# Patient Record
Sex: Female | Born: 1990 | Race: White | Hispanic: No | Marital: Married | Smoking: Never smoker
Health system: Southern US, Academic
[De-identification: ages and names within clinical notes are randomized; demographics above are authoritative.]

## PROBLEM LIST (undated history)

## (undated) DIAGNOSIS — J45909 Unspecified asthma, uncomplicated: Secondary | ICD-10-CM

## (undated) DIAGNOSIS — R51 Headache: Secondary | ICD-10-CM

## (undated) DIAGNOSIS — F419 Anxiety disorder, unspecified: Secondary | ICD-10-CM

## (undated) DIAGNOSIS — R519 Headache, unspecified: Secondary | ICD-10-CM

## (undated) HISTORY — PX: TONSILLECTOMY: SUR1361

## (undated) HISTORY — DX: Headache, unspecified: R51.9

## (undated) HISTORY — DX: Headache: R51

## (undated) HISTORY — DX: Unspecified asthma, uncomplicated: J45.909

## (undated) HISTORY — PX: HX TONSILLECTOMY: SHX27

---

## 2017-06-26 ENCOUNTER — Other Ambulatory Visit: Payer: Self-pay | Admitting: Nurse Practitioner

## 2017-06-26 ENCOUNTER — Other Ambulatory Visit (HOSPITAL_COMMUNITY)
Admission: RE | Admit: 2017-06-26 | Discharge: 2017-06-26 | Disposition: A | Discharge status: Home / Self Care | Payer: BLUE CROSS/BLUE SHIELD | Source: Ambulatory Visit | Attending: Nurse Practitioner | Admitting: Nurse Practitioner

## 2017-06-26 DIAGNOSIS — F411 Generalized anxiety disorder: Secondary | ICD-10-CM | POA: Diagnosis not present

## 2017-06-26 DIAGNOSIS — Z3169 Encounter for other general counseling and advice on procreation: Secondary | ICD-10-CM | POA: Diagnosis not present

## 2017-06-26 DIAGNOSIS — Z01419 Encounter for gynecological examination (general) (routine) without abnormal findings: Secondary | ICD-10-CM | POA: Insufficient documentation

## 2017-06-28 LAB — CYTOLOGY - PAP: Diagnosis: NEGATIVE

## 2017-08-23 DIAGNOSIS — Z13220 Encounter for screening for lipoid disorders: Secondary | ICD-10-CM | POA: Diagnosis not present

## 2017-08-23 DIAGNOSIS — Z0000 Encounter for general adult medical examination without abnormal findings: Secondary | ICD-10-CM | POA: Diagnosis not present

## 2017-09-17 DIAGNOSIS — Z713 Dietary counseling and surveillance: Secondary | ICD-10-CM | POA: Diagnosis not present

## 2017-11-15 DIAGNOSIS — Z3201 Encounter for pregnancy test, result positive: Secondary | ICD-10-CM | POA: Diagnosis not present

## 2017-12-10 DIAGNOSIS — Z713 Dietary counseling and surveillance: Secondary | ICD-10-CM | POA: Diagnosis not present

## 2017-12-14 LAB — OB RESULTS CONSOLE ABO/RH: RH TYPE: POSITIVE

## 2017-12-14 LAB — OB RESULTS CONSOLE RPR: RPR: NONREACTIVE

## 2017-12-14 LAB — OB RESULTS CONSOLE HIV ANTIBODY (ROUTINE TESTING): HIV: NONREACTIVE

## 2017-12-14 LAB — OB RESULTS CONSOLE GC/CHLAMYDIA
CHLAMYDIA, DNA PROBE: NEGATIVE
Gonorrhea: NEGATIVE

## 2017-12-14 LAB — OB RESULTS CONSOLE HEPATITIS B SURFACE ANTIGEN: Hepatitis B Surface Ag: NEGATIVE

## 2017-12-14 LAB — OB RESULTS CONSOLE RUBELLA ANTIBODY, IGM: Rubella: IMMUNE

## 2017-12-14 LAB — OB RESULTS CONSOLE ANTIBODY SCREEN: Antibody Screen: NEGATIVE

## 2017-12-20 DIAGNOSIS — Z3402 Encounter for supervision of normal first pregnancy, second trimester: Secondary | ICD-10-CM | POA: Diagnosis not present

## 2018-01-15 DIAGNOSIS — Z3400 Encounter for supervision of normal first pregnancy, unspecified trimester: Secondary | ICD-10-CM | POA: Diagnosis not present

## 2018-01-15 DIAGNOSIS — Z3402 Encounter for supervision of normal first pregnancy, second trimester: Secondary | ICD-10-CM | POA: Diagnosis not present

## 2018-02-12 DIAGNOSIS — Z3402 Encounter for supervision of normal first pregnancy, second trimester: Secondary | ICD-10-CM | POA: Diagnosis not present

## 2018-02-12 DIAGNOSIS — Z36 Encounter for antenatal screening for chromosomal anomalies: Secondary | ICD-10-CM | POA: Diagnosis not present

## 2018-04-04 DIAGNOSIS — Z3402 Encounter for supervision of normal first pregnancy, second trimester: Secondary | ICD-10-CM | POA: Diagnosis not present

## 2018-04-26 DIAGNOSIS — Z23 Encounter for immunization: Secondary | ICD-10-CM | POA: Diagnosis not present

## 2018-06-07 DIAGNOSIS — Z3403 Encounter for supervision of normal first pregnancy, third trimester: Secondary | ICD-10-CM | POA: Diagnosis not present

## 2018-06-14 DIAGNOSIS — O368390 Maternal care for abnormalities of the fetal heart rate or rhythm, unspecified trimester, not applicable or unspecified: Secondary | ICD-10-CM | POA: Diagnosis not present

## 2018-07-04 ENCOUNTER — Encounter (HOSPITAL_COMMUNITY): Payer: Self-pay | Admitting: *Deleted

## 2018-07-04 ENCOUNTER — Inpatient Hospital Stay (HOSPITAL_COMMUNITY)
Admission: AD | Admit: 2018-07-04 | Discharge: 2018-07-04 | Disposition: A | Discharge status: Home / Self Care | Payer: BLUE CROSS/BLUE SHIELD | Attending: Obstetrics & Gynecology | Admitting: Obstetrics & Gynecology

## 2018-07-04 ENCOUNTER — Other Ambulatory Visit: Payer: Self-pay

## 2018-07-04 DIAGNOSIS — O99333 Smoking (tobacco) complicating pregnancy, third trimester: Secondary | ICD-10-CM | POA: Diagnosis not present

## 2018-07-04 DIAGNOSIS — F17210 Nicotine dependence, cigarettes, uncomplicated: Secondary | ICD-10-CM | POA: Insufficient documentation

## 2018-07-04 DIAGNOSIS — Z880 Allergy status to penicillin: Secondary | ICD-10-CM | POA: Diagnosis not present

## 2018-07-04 DIAGNOSIS — O99513 Diseases of the respiratory system complicating pregnancy, third trimester: Secondary | ICD-10-CM | POA: Insufficient documentation

## 2018-07-04 DIAGNOSIS — O99343 Other mental disorders complicating pregnancy, third trimester: Secondary | ICD-10-CM | POA: Diagnosis not present

## 2018-07-04 DIAGNOSIS — J45909 Unspecified asthma, uncomplicated: Secondary | ICD-10-CM | POA: Diagnosis not present

## 2018-07-04 DIAGNOSIS — Z881 Allergy status to other antibiotic agents status: Secondary | ICD-10-CM | POA: Diagnosis not present

## 2018-07-04 DIAGNOSIS — Z818 Family history of other mental and behavioral disorders: Secondary | ICD-10-CM | POA: Insufficient documentation

## 2018-07-04 DIAGNOSIS — Z3A40 40 weeks gestation of pregnancy: Secondary | ICD-10-CM | POA: Diagnosis not present

## 2018-07-04 DIAGNOSIS — O368130 Decreased fetal movements, third trimester, not applicable or unspecified: Principal | ICD-10-CM | POA: Insufficient documentation

## 2018-07-04 DIAGNOSIS — Z79899 Other long term (current) drug therapy: Secondary | ICD-10-CM | POA: Diagnosis not present

## 2018-07-04 DIAGNOSIS — O480 Post-term pregnancy: Secondary | ICD-10-CM | POA: Diagnosis not present

## 2018-07-04 DIAGNOSIS — O368190 Decreased fetal movements, unspecified trimester, not applicable or unspecified: Secondary | ICD-10-CM

## 2018-07-04 DIAGNOSIS — F419 Anxiety disorder, unspecified: Secondary | ICD-10-CM | POA: Diagnosis not present

## 2018-07-04 HISTORY — DX: Unspecified asthma, uncomplicated: J45.909

## 2018-07-04 HISTORY — DX: Anxiety disorder, unspecified: F41.9

## 2018-07-04 NOTE — MAU Note (Signed)
Urine sent to lab 

## 2018-07-04 NOTE — Discharge Instructions (Signed)

## 2018-07-04 NOTE — MAU Note (Signed)
Last couple days has not been as active as usual, sent from office.  Nothing else. cx 1cm

## 2018-07-04 NOTE — MAU Provider Note (Signed)
Obstetric Attending MAU Note  Chief Complaint:  Decreased Fetal Movement   First Provider Initiated Contact with Patient 07/04/18 1716     HPI: Krista Lawrence is a 28 y.o. G1P0 at [redacted]w[redacted]d who presents to maternity admissions reporting decreased fetal movement today. Seen in the office and did not have time for NST. Sent here for reassurance. Notes difficulty in feeling fetal movement for a while. Good movement yesterday. Scheduled for IOL 07/09/2018 Denies contractions, leakage of fluid or vaginal bleeding.   Pregnancy Course: Receives care at [facility] There are no active problems to display for this patient.   Past Medical History:  Diagnosis Date  . Anxiety   . Asthma     OB History  Gravida Para Term Preterm AB Living  1            SAB TAB Ectopic Multiple Live Births               # Outcome Date GA Lbr Len/2nd Weight Sex Delivery Anes PTL Lv  1 Current             Past Surgical History:  Procedure Laterality Date  . TONSILLECTOMY      Family History: Family History  Problem Relation Age of Onset  . Bipolar disorder Mother   . Rheum arthritis Mother   . Lupus Sister     Social History: Social History   Tobacco Use  . Smoking status: Light Tobacco Smoker  . Smokeless tobacco: Never Used  . Tobacco comment: occ  Substance Use Topics  . Alcohol use: Not Currently  . Drug use: Not Currently    Allergies:  Allergies  Allergen Reactions  . Amoxicillin Rash  . Augmentin [Amoxicillin-Pot Clavulanate] Rash  . Azithromycin Rash  . Penicillins Rash    Medications Prior to Admission  Medication Sig Dispense Refill Last Dose  . Prenatal Vit-Fe Fumarate-FA (MULTIVITAMIN-PRENATAL) 27-0.8 MG TABS tablet Take 1 tablet by mouth daily at 12 noon.   07/03/2018 at Unknown time  . sertraline (ZOLOFT) 25 MG tablet Take 25 mg by mouth daily.   07/04/2018 at Unknown time    ROS: Pertinent findings in history of present illness.  Physical Exam  Blood pressure  127/80, pulse 80, temperature 98.4 F (36.9 C), temperature source Oral, resp. rate 17, height 5\' 3"  (1.6 m), weight 72.6 kg, SpO2 100 %. CONSTITUTIONAL: Well-developed, well-nourished female in no acute distress.  HENT:  Normocephalic, atraumatic, External right and left ear normal.  EYES: Conjunctivae and EOM are normal.No scleral icterus.  NECK: Normal range of motion, supple, no masses SKIN: Skin is warm and dry. No rash noted. NEUROLGIC: Alert and oriented to person, place, and time. Normal reflexes, muscle tone coordination. PSYCHIATRIC: Normal mood and affect. Normal behavior. Normal judgment and thought content. CARDIOVASCULAR: Normal heart rate noted, regular rhythm RESPIRATORY: Effort and breath sounds normal ABDOMEN: Soft, nontender, nondistended, gravid appropriate for gestational age MUSCULOSKELETAL: Normal range of motion. No edema and no tenderness. 2+ distal pulses.  FHT:  Baseline 130 , moderate variability, accelerations present, no decelerations Contractions: irregular  Bedside u/s: vertex, nml fluid, subjectively, good fetal movement noted and shown to parents, placenta appears anterior which may blunt perception of fetal movement.  Assessment: 1. Decreased fetal movement affecting management of pregnancy, antepartum, single or unspecified fetus   Reassuring fetal testing in MAU  Plan: Discharge home Labor precautions and fetal kick counts reviewed Follow up per OB provider  Follow-up Information    Gynecology, [facility]  And Follow up.   Specialty:  Obstetrics and Gynecology Why:  keep follow-up appointment Contact information: [address] [place] [address] [phone]           Allergies as of 07/04/2018      Reactions   Amoxicillin Rash   Augmentin [amoxicillin-pot Clavulanate] Rash   Azithromycin Rash   Penicillins Rash      Medication List    TAKE these medications   multivitamin-prenatal 27-0.8 MG Tabs tablet Take  1 tablet by mouth daily at 12 noon.   sertraline 25 MG tablet Commonly known as:  ZOLOFT Take 25 mg by mouth daily.       Reva Bores, MD 07/04/2018 5:57 PM

## 2018-07-05 ENCOUNTER — Other Ambulatory Visit: Payer: Self-pay | Admitting: Advanced Practice Midwife

## 2018-07-08 ENCOUNTER — Telehealth (HOSPITAL_COMMUNITY): Payer: Self-pay | Admitting: *Deleted

## 2018-07-08 ENCOUNTER — Encounter (HOSPITAL_COMMUNITY): Payer: Self-pay | Admitting: *Deleted

## 2018-07-08 DIAGNOSIS — O368130 Decreased fetal movements, third trimester, not applicable or unspecified: Secondary | ICD-10-CM | POA: Diagnosis not present

## 2018-07-08 LAB — OB RESULTS CONSOLE GBS: STREP GROUP B AG: NEGATIVE

## 2018-07-08 NOTE — Telephone Encounter (Signed)
Preadmission screen  

## 2018-07-09 ENCOUNTER — Other Ambulatory Visit (HOSPITAL_COMMUNITY): Payer: Self-pay | Admitting: *Deleted

## 2018-07-09 ENCOUNTER — Telehealth (HOSPITAL_COMMUNITY): Payer: Self-pay | Admitting: *Deleted

## 2018-07-09 ENCOUNTER — Inpatient Hospital Stay (HOSPITAL_COMMUNITY)
Admission: AD | Admit: 2018-07-09 | Discharge: 2018-07-12 | DRG: 788 | Disposition: A | Discharge status: Home / Self Care | Payer: BLUE CROSS/BLUE SHIELD | Attending: Obstetrics & Gynecology | Admitting: Obstetrics & Gynecology

## 2018-07-09 ENCOUNTER — Encounter (HOSPITAL_COMMUNITY): Payer: Self-pay | Admitting: *Deleted

## 2018-07-09 ENCOUNTER — Other Ambulatory Visit: Payer: Self-pay

## 2018-07-09 ENCOUNTER — Encounter (HOSPITAL_COMMUNITY): Payer: Self-pay

## 2018-07-09 ENCOUNTER — Other Ambulatory Visit: Payer: Self-pay | Admitting: Obstetrics & Gynecology

## 2018-07-09 DIAGNOSIS — O99344 Other mental disorders complicating childbirth: Secondary | ICD-10-CM | POA: Diagnosis present

## 2018-07-09 DIAGNOSIS — F419 Anxiety disorder, unspecified: Secondary | ICD-10-CM | POA: Diagnosis present

## 2018-07-09 DIAGNOSIS — Z3A41 41 weeks gestation of pregnancy: Secondary | ICD-10-CM

## 2018-07-09 DIAGNOSIS — O480 Post-term pregnancy: Secondary | ICD-10-CM | POA: Diagnosis not present

## 2018-07-09 DIAGNOSIS — Z87891 Personal history of nicotine dependence: Secondary | ICD-10-CM | POA: Diagnosis not present

## 2018-07-09 DIAGNOSIS — J45909 Unspecified asthma, uncomplicated: Secondary | ICD-10-CM | POA: Diagnosis not present

## 2018-07-09 DIAGNOSIS — O9952 Diseases of the respiratory system complicating childbirth: Secondary | ICD-10-CM | POA: Diagnosis present

## 2018-07-09 DIAGNOSIS — Z3A00 Weeks of gestation of pregnancy not specified: Secondary | ICD-10-CM | POA: Diagnosis not present

## 2018-07-09 DIAGNOSIS — Z3480 Encounter for supervision of other normal pregnancy, unspecified trimester: Secondary | ICD-10-CM

## 2018-07-09 LAB — TYPE AND SCREEN
ABO/RH(D): O POS
Antibody Screen: NEGATIVE

## 2018-07-09 LAB — POCT FERN TEST: POCT Fern Test: NEGATIVE

## 2018-07-09 LAB — CBC
HEMATOCRIT: 36.9 % (ref 36.0–46.0)
Hemoglobin: 12.7 g/dL (ref 12.0–15.0)
MCH: 31.6 pg (ref 26.0–34.0)
MCHC: 34.4 g/dL (ref 30.0–36.0)
MCV: 91.8 fL (ref 80.0–100.0)
NRBC: 0 % (ref 0.0–0.2)
Platelets: 172 10*3/uL (ref 150–400)
RBC: 4.02 MIL/uL (ref 3.87–5.11)
RDW: 12.6 % (ref 11.5–15.5)
WBC: 11.9 10*3/uL — AB (ref 4.0–10.5)

## 2018-07-09 LAB — ABO/RH: ABO/RH(D): O POS

## 2018-07-09 MED ORDER — OXYCODONE-ACETAMINOPHEN 5-325 MG PO TABS: 2.0000 | ORAL_TABLET | ORAL | Status: DC | PRN

## 2018-07-09 MED ORDER — LACTATED RINGERS IV SOLN
INTRAVENOUS | Status: DC
  Administered 2018-07-09 – 2018-07-10 (×3): via INTRAVENOUS

## 2018-07-09 MED ORDER — OXYTOCIN 40 UNITS IN NORMAL SALINE INFUSION - SIMPLE MED: 2.5000 [IU]/h | INTRAVENOUS | Status: DC

## 2018-07-09 MED ORDER — ONDANSETRON HCL 4 MG/2ML IJ SOLN
4.0000 mg | Freq: Four times a day (QID) | INTRAMUSCULAR | Status: DC | PRN
  Administered 2018-07-10 (×2): 4 mg via INTRAVENOUS
  Filled 2018-07-09: qty 2

## 2018-07-09 MED ORDER — SOD CITRATE-CITRIC ACID 500-334 MG/5ML PO SOLN
30.0000 mL | ORAL | Status: DC | PRN
  Administered 2018-07-10: 30 mL via ORAL
  Filled 2018-07-09 (×2): qty 15

## 2018-07-09 MED ORDER — BUTORPHANOL TARTRATE 1 MG/ML IJ SOLN
1.0000 mg | INTRAMUSCULAR | Status: DC | PRN
  Administered 2018-07-10 (×4): 1 mg via INTRAVENOUS
  Filled 2018-07-09 (×4): qty 1

## 2018-07-09 MED ORDER — ACETAMINOPHEN 325 MG PO TABS: 650.0000 mg | ORAL_TABLET | ORAL | Status: DC | PRN

## 2018-07-09 MED ORDER — MISOPROSTOL 25 MCG QUARTER TABLET
25.0000 ug | ORAL_TABLET | ORAL | Status: DC | PRN
  Administered 2018-07-09 – 2018-07-10 (×3): 25 ug via VAGINAL
  Filled 2018-07-09 (×5): qty 1

## 2018-07-09 MED ORDER — OXYCODONE-ACETAMINOPHEN 5-325 MG PO TABS: 1.0000 | ORAL_TABLET | ORAL | Status: DC | PRN

## 2018-07-09 MED ORDER — LIDOCAINE HCL (PF) 1 % IJ SOLN: 30.0000 mL | INTRAMUSCULAR | Status: DC | PRN

## 2018-07-09 MED ORDER — LACTATED RINGERS IV SOLN
500.0000 mL | INTRAVENOUS | Status: DC | PRN
  Administered 2018-07-09: 500 mL via INTRAVENOUS

## 2018-07-09 MED ORDER — OXYTOCIN BOLUS FROM INFUSION: 500.0000 mL | Freq: Once | INTRAVENOUS | Status: DC

## 2018-07-09 MED ORDER — TERBUTALINE SULFATE 1 MG/ML IJ SOLN: 0.2500 mg | Freq: Once | INTRAMUSCULAR | Status: DC | PRN

## 2018-07-09 NOTE — Telephone Encounter (Signed)
Preadmission screen  

## 2018-07-09 NOTE — Progress Notes (Signed)
RAQUEL LEMERISE is a 28 y.o. G1P0 at [redacted]w[redacted]d by admitted for induction of labor due to Post dates. Due date 07/02/18.  Subjective: Patient reports feeling well. She can feel mild tightening with her contractions but did not know they were contractions. Discussed plan of care with patient (cytotec induction of labor) and gave her and partner opportunity to ask questions.   Objective: Vitals:   07/09/18 1721 07/09/18 1853  BP: 127/74 118/76  Pulse: 96 85  Resp: 17 17  Temp: 98.5 F (36.9 C) 98.7 F (37.1 C)  SpO2: 99%    FHT:  FHR: 120s bpm, variability: moderate,  accelerations:  Present,  decelerations:  Absent UC:   irregular, every 4-6 minutes SVE:   Dilation: 1 Effacement (%): Thick Station: -3 Exam by:: Karl Ito, rnc  Labs: No results found for: WBC, HGB, HCT, MCV, PLT  Assessment / Plan: Induction of labor due to postdates, progressing on cytotec  Labor: Progressing normally Preeclampsia:  no signs or symptoms of toxicity, intake and ouput balanced and labs stable Fetal Wellbeing:  Category I Pain Control:  Labor support without medications I/D:  n/a Anticipated MOD:  NSVD  Janeece Riggers 07/09/2018, 7:53 PM

## 2018-07-09 NOTE — H&P (Signed)
HPI: 28 y/o G1P0 @ [redacted]w[redacted]d estimated gestational age (as dated by LMP c/w 20 week ultrasound) presents complaining for scheduled IOL due to full term pregnancy.   no Leaking of Fluid,   no Vaginal Bleeding,   Some irregular cramping,  + Fetal Movement.  Prenatal care has been provided by Dr. Charlotta Newton  ROS: no HA, no epigastric pain, no visual changes.    Pregnancy complicated by: 1) Asthma Symbicort daily and ventolin HFA as needed 2) Anxiety- currently on zoloft 25mg  daily   Prenatal Transfer Tool  Maternal Diabetes: No Genetic Screening: Normal Maternal Ultrasounds/Referrals: Normal Fetal Ultrasounds or other Referrals:  None Maternal Substance Abuse:  No Significant Maternal Medications:  Meds include: Other: synbicort, zoloft Significant Maternal Lab Results: Lab values include: Group B Strep negative   PNL:  GBS neg, Rub Immune, Hep B neg, RPR NR, HIV neg, GC/C neg, glucola:121 Hgb: 12.2 Blood type: O positive, antibody neg  Immunizations: Tdap: 12/27 Flu: declined  OBHx: primp PMHx:  Asthma, anxiety Meds:  PNV, zoloft, symbicort, ventolin HFA Allergy:   Allergies  Allergen Reactions  . Amoxicillin Rash  . Augmentin [Amoxicillin-Pot Clavulanate] Rash  . Azithromycin Rash  . Penicillins Rash   SurgHx: none SocHx:   no Tobacco, no  EtOH, no Illicit Drugs  O: to be collected Gen. AAOx3, NAD CV.  RRR  No murmur.  Resp. CTAB, no wheeze or crackles. Abd. Gravid,  no tenderness,  no rigidity,  no guarding Extr.  no edema B/L , no calf tenderness, neg Homan's B/L  FHT: 140 by doppler SVE: 1/long/high, soft, vertex by exam   Labs: see orders  A/P:  28 y.o. G1P0 @ [redacted]w[redacted]d EGA who presents for IOL -FWB:  NICHD Cat I FHTs -Induction: start cytotec per protocol -GBS: negative -Anxiety: continue zoloft daily, plan for atarax if needed -Pain management: IV or epidural upon request -CBC, T&S to be collected  Myna Hidalgo, DO [phone] (cell) [phone]  (office)

## 2018-07-09 NOTE — MAU Note (Signed)
Scheduled for midnight induction.  ? Leaking, maybe 3 days ago. Saw dr yesterday, was checked, didn't think it was.  Today, has had more fluid. Little gushes every few hours- clear and watery. No bleeding.

## 2018-07-10 ENCOUNTER — Inpatient Hospital Stay (HOSPITAL_COMMUNITY): Payer: BLUE CROSS/BLUE SHIELD | Admitting: Anesthesiology

## 2018-07-10 ENCOUNTER — Inpatient Hospital Stay (HOSPITAL_COMMUNITY): Payer: BLUE CROSS/BLUE SHIELD

## 2018-07-10 ENCOUNTER — Encounter (HOSPITAL_COMMUNITY)
Admission: AD | Disposition: A | Discharge status: Home / Self Care | Payer: Self-pay | Source: Home / Self Care | Attending: Obstetrics & Gynecology

## 2018-07-10 LAB — RPR: RPR Ser Ql: NONREACTIVE

## 2018-07-10 SURGERY — Surgical Case
Anesthesia: Regional

## 2018-07-10 MED ORDER — MORPHINE SULFATE (PF) 0.5 MG/ML IJ SOLN
INTRAMUSCULAR | Status: AC
  Filled 2018-07-10: qty 10

## 2018-07-10 MED ORDER — LACTATED RINGERS IV SOLN: 500.0000 mL | Freq: Once | INTRAVENOUS | Status: DC

## 2018-07-10 MED ORDER — DIPHENHYDRAMINE HCL 25 MG PO CAPS: 25.0000 mg | ORAL_CAPSULE | ORAL | Status: DC | PRN

## 2018-07-10 MED ORDER — ACETAMINOPHEN 325 MG PO TABS
650.0000 mg | ORAL_TABLET | ORAL | Status: DC | PRN
  Administered 2018-07-11 – 2018-07-12 (×3): 650 mg via ORAL
  Filled 2018-07-10 (×3): qty 2

## 2018-07-10 MED ORDER — CLINDAMYCIN PHOSPHATE 300 MG/50ML IV SOLN
INTRAVENOUS | Status: AC
  Filled 2018-07-10: qty 50

## 2018-07-10 MED ORDER — NALOXONE HCL 4 MG/10ML IJ SOLN
1.0000 ug/kg/h | INTRAVENOUS | Status: DC | PRN
  Filled 2018-07-10: qty 5

## 2018-07-10 MED ORDER — GENTAMICIN SULFATE 40 MG/ML IJ SOLN
5.0000 mg/kg | INTRAVENOUS | Status: AC
  Administered 2018-07-10: 370 mg via INTRAVENOUS
  Filled 2018-07-10: qty 9.25

## 2018-07-10 MED ORDER — SENNOSIDES-DOCUSATE SODIUM 8.6-50 MG PO TABS
2.0000 | ORAL_TABLET | ORAL | Status: DC
  Administered 2018-07-12: 2 via ORAL
  Filled 2018-07-10: qty 2

## 2018-07-10 MED ORDER — KETOROLAC TROMETHAMINE 30 MG/ML IJ SOLN: 30.0000 mg | Freq: Once | INTRAMUSCULAR | Status: DC | PRN

## 2018-07-10 MED ORDER — OXYTOCIN 40 UNITS IN NORMAL SALINE INFUSION - SIMPLE MED
INTRAVENOUS | Status: AC
  Filled 2018-07-10: qty 1000

## 2018-07-10 MED ORDER — KETOROLAC TROMETHAMINE 30 MG/ML IJ SOLN
30.0000 mg | Freq: Four times a day (QID) | INTRAMUSCULAR | Status: AC
  Administered 2018-07-11 (×3): 30 mg via INTRAVENOUS
  Filled 2018-07-10 (×3): qty 1

## 2018-07-10 MED ORDER — FENTANYL-BUPIVACAINE-NACL 0.5-0.125-0.9 MG/250ML-% EP SOLN
12.0000 mL/h | EPIDURAL | Status: DC | PRN
  Filled 2018-07-10: qty 250

## 2018-07-10 MED ORDER — OXYTOCIN 40 UNITS IN NORMAL SALINE INFUSION - SIMPLE MED
1.0000 m[IU]/min | INTRAVENOUS | Status: DC
  Administered 2018-07-10: 2 m[IU]/min via INTRAVENOUS
  Filled 2018-07-10: qty 1000

## 2018-07-10 MED ORDER — SODIUM BICARBONATE 8.4 % IV SOLN
INTRAVENOUS | Status: DC | PRN
  Administered 2018-07-10 (×3): 5 mL via EPIDURAL

## 2018-07-10 MED ORDER — NALBUPHINE HCL 10 MG/ML IJ SOLN: 5.0000 mg | INTRAMUSCULAR | Status: DC | PRN

## 2018-07-10 MED ORDER — EPHEDRINE 5 MG/ML INJ: 10.0000 mg | INTRAVENOUS | Status: DC | PRN

## 2018-07-10 MED ORDER — SIMETHICONE 80 MG PO CHEW
80.0000 mg | CHEWABLE_TABLET | ORAL | Status: DC
  Administered 2018-07-12: 80 mg via ORAL
  Filled 2018-07-10: qty 1

## 2018-07-10 MED ORDER — PHENYLEPHRINE 40 MCG/ML (10ML) SYRINGE FOR IV PUSH (FOR BLOOD PRESSURE SUPPORT)
PREFILLED_SYRINGE | INTRAVENOUS | Status: AC
  Filled 2018-07-10: qty 10

## 2018-07-10 MED ORDER — OXYTOCIN 40 UNITS IN NORMAL SALINE INFUSION - SIMPLE MED: 2.5000 [IU]/h | INTRAVENOUS | Status: AC

## 2018-07-10 MED ORDER — PHENYLEPHRINE 40 MCG/ML (10ML) SYRINGE FOR IV PUSH (FOR BLOOD PRESSURE SUPPORT): 80.0000 ug | PREFILLED_SYRINGE | INTRAVENOUS | Status: DC | PRN

## 2018-07-10 MED ORDER — NALOXONE HCL 0.4 MG/ML IJ SOLN: 0.4000 mg | INTRAMUSCULAR | Status: DC | PRN

## 2018-07-10 MED ORDER — LACTATED RINGERS IV SOLN
INTRAVENOUS | Status: DC
  Administered 2018-07-11: 04:00:00 via INTRAVENOUS

## 2018-07-10 MED ORDER — DEXAMETHASONE SODIUM PHOSPHATE 4 MG/ML IJ SOLN
INTRAMUSCULAR | Status: AC
  Filled 2018-07-10: qty 1

## 2018-07-10 MED ORDER — IBUPROFEN 600 MG PO TABS
600.0000 mg | ORAL_TABLET | Freq: Four times a day (QID) | ORAL | Status: DC
  Administered 2018-07-12 (×4): 600 mg via ORAL
  Filled 2018-07-10 (×4): qty 1

## 2018-07-10 MED ORDER — MORPHINE SULFATE (PF) 0.5 MG/ML IJ SOLN
INTRAMUSCULAR | Status: DC | PRN
  Administered 2018-07-10: 3 mg via EPIDURAL

## 2018-07-10 MED ORDER — SIMETHICONE 80 MG PO CHEW
80.0000 mg | CHEWABLE_TABLET | Freq: Three times a day (TID) | ORAL | Status: DC
  Administered 2018-07-11 – 2018-07-12 (×5): 80 mg via ORAL
  Filled 2018-07-10 (×5): qty 1

## 2018-07-10 MED ORDER — FENTANYL-BUPIVACAINE-NACL 0.5-0.125-0.9 MG/250ML-% EP SOLN: 12.0000 mL/h | EPIDURAL | Status: DC | PRN

## 2018-07-10 MED ORDER — ONDANSETRON HCL 4 MG/2ML IJ SOLN: 4.0000 mg | Freq: Three times a day (TID) | INTRAMUSCULAR | Status: DC | PRN

## 2018-07-10 MED ORDER — SIMETHICONE 80 MG PO CHEW: 80.0000 mg | CHEWABLE_TABLET | ORAL | Status: DC | PRN

## 2018-07-10 MED ORDER — OXYTOCIN 10 UNIT/ML IJ SOLN
INTRAVENOUS | Status: DC | PRN
  Administered 2018-07-10: 40 [IU] via INTRAVENOUS

## 2018-07-10 MED ORDER — DIPHENHYDRAMINE HCL 50 MG/ML IJ SOLN: 12.5000 mg | INTRAMUSCULAR | Status: DC | PRN

## 2018-07-10 MED ORDER — SODIUM BICARBONATE 8.4 % IV SOLN
INTRAVENOUS | Status: AC
  Filled 2018-07-10: qty 50

## 2018-07-10 MED ORDER — DEXAMETHASONE SODIUM PHOSPHATE 4 MG/ML IJ SOLN
INTRAMUSCULAR | Status: DC | PRN
  Administered 2018-07-10: 4 mg via INTRAVENOUS

## 2018-07-10 MED ORDER — PRENATAL MULTIVITAMIN CH
1.0000 | ORAL_TABLET | Freq: Every day | ORAL | Status: DC
  Administered 2018-07-11 – 2018-07-12 (×2): 1 via ORAL
  Filled 2018-07-10 (×2): qty 1

## 2018-07-10 MED ORDER — SODIUM CHLORIDE 0.9 % IV SOLN
INTRAVENOUS | Status: DC | PRN
  Administered 2018-07-10: 21:00:00 via INTRAVENOUS

## 2018-07-10 MED ORDER — WITCH HAZEL-GLYCERIN EX PADS: 1.0000 "application " | MEDICATED_PAD | CUTANEOUS | Status: DC | PRN

## 2018-07-10 MED ORDER — SERTRALINE HCL 25 MG PO TABS
25.0000 mg | ORAL_TABLET | Freq: Every day | ORAL | Status: DC
  Administered 2018-07-11 – 2018-07-12 (×2): 25 mg via ORAL
  Filled 2018-07-10 (×2): qty 1

## 2018-07-10 MED ORDER — OXYTOCIN 10 UNIT/ML IJ SOLN
INTRAMUSCULAR | Status: AC
  Filled 2018-07-10: qty 4

## 2018-07-10 MED ORDER — NALBUPHINE HCL 10 MG/ML IJ SOLN: 5.0000 mg | Freq: Once | INTRAMUSCULAR | Status: DC | PRN

## 2018-07-10 MED ORDER — SODIUM CHLORIDE (PF) 0.9 % IJ SOLN
INTRAMUSCULAR | Status: DC | PRN
  Administered 2018-07-10: 12 mL/h via EPIDURAL

## 2018-07-10 MED ORDER — MENTHOL 3 MG MT LOZG: 1.0000 | LOZENGE | OROMUCOSAL | Status: DC | PRN

## 2018-07-10 MED ORDER — SCOPOLAMINE 1 MG/3DAYS TD PT72: 1.0000 | MEDICATED_PATCH | Freq: Once | TRANSDERMAL | Status: DC

## 2018-07-10 MED ORDER — CLINDAMYCIN PHOSPHATE 900 MG/50ML IV SOLN
900.0000 mg | INTRAVENOUS | Status: AC
  Administered 2018-07-10: 900 mg via INTRAVENOUS

## 2018-07-10 MED ORDER — LIDOCAINE-EPINEPHRINE (PF) 2 %-1:200000 IJ SOLN
INTRAMUSCULAR | Status: AC
  Filled 2018-07-10: qty 10

## 2018-07-10 MED ORDER — PROMETHAZINE HCL 25 MG/ML IJ SOLN: 6.2500 mg | INTRAMUSCULAR | Status: DC | PRN

## 2018-07-10 MED ORDER — CLINDAMYCIN PHOSPHATE 900 MG/50ML IV SOLN
INTRAVENOUS | Status: AC
  Filled 2018-07-10: qty 50

## 2018-07-10 MED ORDER — COCONUT OIL OIL: 1.0000 "application " | TOPICAL_OIL | Status: DC | PRN

## 2018-07-10 MED ORDER — LACTATED RINGERS IV SOLN
INTRAVENOUS | Status: DC | PRN
  Administered 2018-07-10: 20:00:00 via INTRAVENOUS

## 2018-07-10 MED ORDER — TERBUTALINE SULFATE 1 MG/ML IJ SOLN
0.2500 mg | Freq: Once | INTRAMUSCULAR | Status: DC | PRN
  Filled 2018-07-10: qty 1

## 2018-07-10 MED ORDER — PHENYLEPHRINE HCL 10 MG/ML IJ SOLN
INTRAMUSCULAR | Status: DC | PRN
  Administered 2018-07-10 (×4): 80 ug via INTRAVENOUS

## 2018-07-10 MED ORDER — OXYCODONE HCL 5 MG PO TABS
5.0000 mg | ORAL_TABLET | ORAL | Status: DC | PRN
  Administered 2018-07-12: 10 mg via ORAL
  Administered 2018-07-12: 5 mg via ORAL
  Filled 2018-07-10: qty 2
  Filled 2018-07-10: qty 1

## 2018-07-10 MED ORDER — SERTRALINE HCL 25 MG PO TABS
25.0000 mg | ORAL_TABLET | Freq: Every day | ORAL | Status: DC
  Administered 2018-07-10: 25 mg via ORAL
  Filled 2018-07-10 (×2): qty 1

## 2018-07-10 MED ORDER — MEPERIDINE HCL 25 MG/ML IJ SOLN: 6.2500 mg | INTRAMUSCULAR | Status: DC | PRN

## 2018-07-10 MED ORDER — ONDANSETRON HCL 4 MG/2ML IJ SOLN
INTRAMUSCULAR | Status: AC
  Filled 2018-07-10: qty 4

## 2018-07-10 MED ORDER — OXYCODONE HCL 5 MG/5ML PO SOLN: 5.0000 mg | Freq: Once | ORAL | Status: DC | PRN

## 2018-07-10 MED ORDER — HYDROMORPHONE HCL 1 MG/ML IJ SOLN: 0.2500 mg | INTRAMUSCULAR | Status: DC | PRN

## 2018-07-10 MED ORDER — ZOLPIDEM TARTRATE 5 MG PO TABS: 5.0000 mg | ORAL_TABLET | Freq: Every evening | ORAL | Status: DC | PRN

## 2018-07-10 MED ORDER — LIDOCAINE HCL (PF) 1 % IJ SOLN
INTRAMUSCULAR | Status: DC | PRN
  Administered 2018-07-10: 5 mL via EPIDURAL

## 2018-07-10 MED ORDER — DIBUCAINE 1 % RE OINT: 1.0000 "application " | TOPICAL_OINTMENT | RECTAL | Status: DC | PRN

## 2018-07-10 MED ORDER — SODIUM CHLORIDE 0.9% FLUSH: 3.0000 mL | INTRAVENOUS | Status: DC | PRN

## 2018-07-10 MED ORDER — ZOLPIDEM TARTRATE 5 MG PO TABS
5.0000 mg | ORAL_TABLET | Freq: Every evening | ORAL | Status: DC | PRN
  Administered 2018-07-10: 5 mg via ORAL
  Filled 2018-07-10: qty 1

## 2018-07-10 MED ORDER — DIPHENHYDRAMINE HCL 25 MG PO CAPS: 25.0000 mg | ORAL_CAPSULE | Freq: Four times a day (QID) | ORAL | Status: DC | PRN

## 2018-07-10 MED ORDER — OXYCODONE HCL 5 MG PO TABS: 5.0000 mg | ORAL_TABLET | Freq: Once | ORAL | Status: DC | PRN

## 2018-07-10 SURGICAL SUPPLY — 39 items
BARRIER ADHS 3X4 INTERCEED (GAUZE/BANDAGES/DRESSINGS) ×1 IMPLANT
BENZOIN TINCTURE PRP APPL 2/3 (GAUZE/BANDAGES/DRESSINGS) ×2 IMPLANT
CHLORAPREP W/TINT 26ML (MISCELLANEOUS) ×2 IMPLANT
CLAMP CORD UMBIL (MISCELLANEOUS) IMPLANT
CLOTH BEACON ORANGE TIMEOUT ST (SAFETY) ×2 IMPLANT
DERMABOND ADVANCED (GAUZE/BANDAGES/DRESSINGS)
DERMABOND ADVANCED .7 DNX12 (GAUZE/BANDAGES/DRESSINGS) IMPLANT
DRSG OPSITE POSTOP 4X10 (GAUZE/BANDAGES/DRESSINGS) ×2 IMPLANT
ELECT REM PT RETURN 9FT ADLT (ELECTROSURGICAL) ×2
ELECTRODE REM PT RTRN 9FT ADLT (ELECTROSURGICAL) ×1 IMPLANT
EXTRACTOR VACUUM KIWI (MISCELLANEOUS) IMPLANT
GLOVE BIOGEL PI IND STRL 7.0 (GLOVE) ×3 IMPLANT
GLOVE BIOGEL PI INDICATOR 7.0 (GLOVE) ×3
GLOVE ECLIPSE 6.5 STRL STRAW (GLOVE) ×2 IMPLANT
GOWN STRL REUS W/TWL LRG LVL3 (GOWN DISPOSABLE) ×6 IMPLANT
KIT ABG SYR 3ML LUER SLIP (SYRINGE) IMPLANT
NDL HYPO 25X5/8 SAFETYGLIDE (NEEDLE) IMPLANT
NEEDLE HYPO 25X5/8 SAFETYGLIDE (NEEDLE) IMPLANT
NS IRRIG 1000ML POUR BTL (IV SOLUTION) ×2 IMPLANT
PACK C SECTION WH (CUSTOM PROCEDURE TRAY) ×2 IMPLANT
PAD ABD 7.5X8 STRL (GAUZE/BANDAGES/DRESSINGS) ×2 IMPLANT
PAD OB MATERNITY 4.3X12.25 (PERSONAL CARE ITEMS) ×2 IMPLANT
PENCIL SMOKE EVAC W/HOLSTER (ELECTROSURGICAL) ×2 IMPLANT
RETRACTOR WND ALEXIS 25 LRG (MISCELLANEOUS) IMPLANT
RTRCTR C-SECT PINK 25CM LRG (MISCELLANEOUS) ×2 IMPLANT
RTRCTR WOUND ALEXIS 25CM LRG (MISCELLANEOUS) ×2
STRIP CLOSURE SKIN 1/2X4 (GAUZE/BANDAGES/DRESSINGS) ×2 IMPLANT
SUT PLAIN 0 NONE (SUTURE) IMPLANT
SUT PLAIN 2 0 XLH (SUTURE) IMPLANT
SUT VIC AB 0 CT1 27 (SUTURE) ×2
SUT VIC AB 0 CT1 27XBRD ANBCTR (SUTURE) ×2 IMPLANT
SUT VIC AB 0 CTX 36 (SUTURE) ×3
SUT VIC AB 0 CTX36XBRD ANBCTRL (SUTURE) ×3 IMPLANT
SUT VIC AB 2-0 CT1 27 (SUTURE) ×1
SUT VIC AB 2-0 CT1 TAPERPNT 27 (SUTURE) ×1 IMPLANT
SUT VIC AB 4-0 KS 27 (SUTURE) ×2 IMPLANT
TOWEL OR 17X24 6PK STRL BLUE (TOWEL DISPOSABLE) ×2 IMPLANT
TRAY FOLEY W/BAG SLVR 14FR LF (SET/KITS/TRAYS/PACK) IMPLANT
WATER STERILE IRR 1000ML POUR (IV SOLUTION) ×2 IMPLANT

## 2018-07-10 NOTE — Progress Notes (Signed)
OB PN:  S: Resting comfortably with epidural  O: BP 107/71   Pulse 65   Temp 97.7 F (36.5 C) (Axillary)   Resp 20   Ht 5\' 3"  (1.6 m)   Wt 73 kg   SpO2 98%   BMI 28.52 kg/m   FHT: 125bpm, moderate variablity, + accels, no decels Toco: q2-72min SVE: 4/60/-2, AROM clear fluid, IUPC placed  A/P: 28 y.o. G2P1001 @ [redacted]w[redacted]d for IOL- full term pregnancy 1. FWB: Cat. I 2. Labor: continue Pit per protocol Pain: continue epidural GBS: neg Anxiety: continue zoloft 25mg  daily Asthma: continue symbicort and ventolin HFA as needed  Myna Hidalgo, DO [phone] (cell) [phone] (office)

## 2018-07-10 NOTE — Transfer of Care (Signed)
Immediate Anesthesia Transfer of Care Note  Patient: Italie Lejeune Cadman[place]  Procedure(s) Performed: CESAREAN SECTION (N/A )  Patient Location: PACU  Anesthesia Type:Epidural  Level of Consciousness: awake, alert  and oriented  Airway & Oxygen Therapy: Patient Spontanous Breathing  Post-op Assessment: Post vital signs: Reviewed and stable HR 74, RR20, SaO2 99%, BP 111/70  Last Vitals:  Vitals Value Taken Time  BP 111/70 07/10/2018  9:19 PM  Temp 36.3 C 07/10/2018  9:19 PM  Pulse 75 07/10/2018  9:21 PM  Resp 17 07/10/2018  9:21 PM  SpO2 99 % 07/10/2018  9:21 PM  Vitals shown include unvalidated device data.  Last Pain:  Vitals:   07/10/18 2119  TempSrc: Oral  PainSc:       Patients Stated Pain Goal: 5 (07/10/18 0730)  Complications: No apparent anesthesia complications

## 2018-07-10 NOTE — Progress Notes (Signed)
OB PN:  S: Feeling more painful contractions, requesting epidural  O: BP 111/61   Pulse (!) 52   Temp 97.7 F (36.5 C) (Oral)   Resp 18   Ht 5\' 3"  (1.6 m)   Wt 73 kg   SpO2 99%   BMI 28.52 kg/m   FHT: 120bpm, moderate variablity, + accels, no decels Toco: q2-72min SVE: deferred  A/P: 28 y.o. G2P1001 @ [redacted]w[redacted]d for IOL- full term pregnancy 1. FWB: Cat. I 2. Labor: continue Pit per protocol Pain: plan for epidural GBS: neg Anxiety: continue zoloft 25mg  daily Asthma: continue symbicort and ventolin HFA as needed  Myna Hidalgo, DO [phone] (cell) [phone] (office)

## 2018-07-10 NOTE — Progress Notes (Addendum)
OB PN:  S: Pt resting comfortably, no acute complaints  O: BP 114/68   Pulse 90   Temp 97.6 F (36.4 C) (Oral)   Resp 18   Ht 5\' 3"  (1.6 m)   Wt 73 kg   SpO2 99%   BMI 28.52 kg/m   FHT: 130bpm, moderate variablity, + accels, no decels Toco: q2-60min SVE: 2/50/-3  A/P: 28 y.o. G2P1001 @ [redacted]w[redacted]d for IOL- full term pregnancy 1. FWB: Cat. I 2. Labor: Foley balloon placed, start Pit per protocol Pain: IV or epidural upon request GBS: neg Pain: IV or epidural upon request Anxiety: continue zoloft 25mg  daily Asthma: continue symbicort and ventolin HFA as needed  Myna Hidalgo, DO [phone] (cell) [phone] (office)

## 2018-07-10 NOTE — Progress Notes (Signed)
At bedside to discuss current management.  Pt noted to have her 2nd prolong deceleration.  FHT to 90bpm x .  Pitocin turned off, patient repositioned and FHT returned to 135bpm.  SVE: 4-5cm which is unchanged from prior exam.  Risk benefits and alternatives of cesarean section were discussed with the patient including but not limited to infection, bleeding, damage to bowel , bladder and baby with the need for further surgery. Pt voiced understanding and desires to proceed.   Myna Hidalgo, DO [phone] (cell) [phone] (office)

## 2018-07-10 NOTE — Anesthesia Preprocedure Evaluation (Signed)
Anesthesia Evaluation  Patient identified by MRN, date of birth, ID band Patient awake    Reviewed: Allergy & Precautions, NPO status , Patient's Chart, lab work & pertinent test results  Airway Mallampati: II  TM Distance: >3 FB Neck ROM: Full    Dental no notable dental hx. (+) Teeth Intact   Pulmonary asthma , former smoker,    Pulmonary exam normal breath sounds clear to auscultation       Cardiovascular Exercise Tolerance: Good negative cardio ROS Normal cardiovascular exam Rhythm:Regular Rate:Normal     Neuro/Psych  Headaches, Anxiety negative psych ROS   GI/Hepatic negative GI ROS, Neg liver ROS,   Endo/Other    Renal/GU      Musculoskeletal   Abdominal   Peds  Hematology hgb 12.7 Plt 172   Anesthesia Other Findings   Reproductive/Obstetrics (+) Pregnancy                             Anesthesia Physical Anesthesia Plan  ASA: II  Anesthesia Plan: Epidural   Post-op Pain Management:    Induction:   PONV Risk Score and Plan:   Airway Management Planned:   Additional Equipment:   Intra-op Plan:   Post-operative Plan:   Informed Consent: I have reviewed the patients History and Physical, chart, labs and discussed the procedure including the risks, benefits and alternatives for the proposed anesthesia with the patient or authorized representative who has indicated his/her understanding and acceptance.       Plan Discussed with: CRNA  Anesthesia Plan Comments:         Anesthesia Quick Evaluation

## 2018-07-10 NOTE — Op Note (Signed)
PreOp Diagnosis:  -Intrauterine pregnancy @ [redacted]w[redacted]d -Fetal intolerance to labor -Anxiety  PostOp Diagnosis: same, OP/asynclitic presentation Procedure: Primary C-section Surgeon: Dr. Myna Hidalgo Anesthesia: epidural Complications: none EBL: 432cc UOP: 100cc Fluids: 1000cc  Findings: Female infant from vertex presentation- asynclitic OP presentation with loose nuchal.  Normal uterus, tubes and ovaries bilaterally  PROCEDURE:  Informed consent was obtained from the patient with risks, benefits, complications, treatment options, and expected outcomes discussed with the patient.  The patient concurred with the proposed plan, giving informed consent with form signed.   The patient was taken to Operating Room, and identified with the procedure verified as C-Section Delivery with Time Out. With induction of anesthesia, the patient was prepped and draped in the usual sterile fashion. A Pfannenstiel incision was made and carried down through the subcutaneous tissue to the fascia. The fascia was incised in the midline and extended transversely. The superior aspect of the fascial incision was grasped with Kochers elevated and the underlying muscle dissected off. The inferior aspect of the facial incision was in similar fashion, grasped elevated and rectus muscles dissected off. The peritoneum was identified and entered. Peritoneal incision was extended longitudinally. Alexis retractor was placed.  The utero-vesical peritoneal reflection was identified and incised transversely with the [facility] scissors, the incision extended laterally, the bladder flap created digitally. A low transverse uterine incision was made and the infants head delivered atraumatically. After the umbilical cord was clamped and cut cord blood was obtained for evaluation.   The placenta was removed intact and appeared normal. The uterine outline, tubes and ovaries appeared normal. The uterine incision was closed with running locked sutures  of 0 Vicryl and a second layer of the same stitch was used in an imbricating fashion.  Excellent hemostasis was obtained.  Interceed was placed.  The pericolic gutters were then cleared of all clots and debris.  Alexis retractor was removed.  Peritoneum was closed in a running fashion.  The fascia was then reapproximated with running sutures of 0 Vicryl. The skin was closed with 4-0 vicryl in a subcuticular fashion.  Instrument, sponge, and needle counts were correct prior the abdominal closure and at the conclusion of the case. The patient was taken to recovery in stable condition.  Myna Hidalgo, DO [phone] (cell) [phone] (office)

## 2018-07-10 NOTE — Progress Notes (Signed)
Krista Lawrence is a 28 y.o. G1P0 at [redacted]w[redacted]d by admitted for induction of labor due to Post dates. Due date 07/02/18.  Subjective: Patient is asleep after requesting Ambien to help her fall asleep.    Objective: Vitals:   07/10/18 0007 07/10/18 0152  BP: 109/74 107/69  Pulse: 71 65  Resp: 17 16  Temp: 98.3 F (36.8 C) 97.8 F (36.6 C)  SpO2:     FHT:  FHR: 130s bpm, variability: moderate,  accelerations:  Present,  decelerations:  Absent UC:   Uterine irritability SVE:   Dilation: 1.5 Effacement (%): 50 Station: -2 Exam by:: Karl Ito, rnc   Labs: Lab Results  Component Value Date   WBC 11.9 (H) 07/09/2018   HGB 12.7 07/09/2018   HCT 36.9 07/09/2018   MCV 91.8 07/09/2018   PLT 172 07/09/2018    Assessment / Plan: Induction of labor due to postdates, progressing on cytotec  Labor: Progressing normally Preeclampsia:  no signs or symptoms of toxicity, intake and ouput balanced and labs stable Fetal Wellbeing:  Category I Pain Control:  Labor support without medications I/D:  n/a Anticipated MOD:  NSVD  Janeece Riggers 07/10/2018, 2:47 AM

## 2018-07-10 NOTE — Anesthesia Procedure Notes (Signed)
Epidural Patient location during procedure: OB Start time: 07/10/2018 1:11 PM End time: 07/10/2018 1:32 PM  Staffing Anesthesiologist: Trevor Iha, MD Performed: anesthesiologist   Preanesthetic Checklist Completed: patient identified, site marked, surgical consent, pre-op evaluation, timeout performed, IV checked, risks and benefits discussed and monitors and equipment checked  Epidural Patient position: sitting Prep: site prepped and draped and DuraPrep Patient monitoring: continuous pulse ox and blood pressure Approach: midline Location: L3-L4 Injection technique: LOR air  Needle:  Needle type: Tuohy  Needle gauge: 17 G Needle length: 9 cm and 9 Needle insertion depth: 7 cm Catheter type: closed end flexible Catheter size: 19 Gauge Catheter at skin depth: 12 cm Test dose: negative  Assessment Events: blood not aspirated, injection not painful, no injection resistance, negative IV test and no paresthesia  Additional Notes Patient identified. Risks/Benefits/Options discussed with patient including but not limited to bleeding, infection, nerve damage, paralysis, failed block, incomplete pain control, headache, blood pressure changes, nausea, vomiting, reactions to medication both or allergic, itching and postpartum back pain. Confirmed with bedside nurse the patient's most recent platelet count. Confirmed with patient that they are not currently taking any anticoagulation, have any bleeding history or any family history of bleeding disorders. Patient expressed understanding and wished to proceed. All questions were answered. Sterile technique was used throughout the entire procedure. Please see nursing notes for vital signs. Test dose was given through epidural needle and negative prior to continuing to dose epidural or start infusion. Warning signs of high block given to the patient including shortness of breath, tingling/numbness in hands, complete motor block, or any  concerning symptoms with instructions to call for help. Patient was given instructions on fall risk and not to get out of bed. All questions and concerns addressed with instructions to call with any issues.2  Attempt (S) . Patient tolerated procedure well.

## 2018-07-10 NOTE — Anesthesia Pain Management Evaluation Note (Signed)
  CRNA Pain Management Visit Note  Patient: Krista Lawrence, 28 y.o., female  "Hello I am a member of the anesthesia team at [facility] and [employer]. We have an anesthesia team available at all times to provide care throughout the hospital, including epidural management and anesthesia for C-section. I don't know your plan for the delivery whether it a natural birth, water birth, IV sedation, nitrous supplementation, doula or epidural, but we want to meet your pain goals."   1.Was your pain managed to your expectations on prior hospitalizations?   No prior hospitalizations  2.What is your expectation for pain management during this hospitalization?     Epidural  3.How can we help you reach that goal?   Record the patient's initial score and the patient's pain goal.   Pain: 7  Pain Goal: 2 [facility] wants you to be able to say your pain was always managed very well.  Laban Emperor 07/10/2018

## 2018-07-11 ENCOUNTER — Encounter (HOSPITAL_COMMUNITY): Payer: Self-pay | Admitting: Obstetrics & Gynecology

## 2018-07-11 LAB — CBC
HCT: 33.9 % — ABNORMAL LOW (ref 36.0–46.0)
Hemoglobin: 11.8 g/dL — ABNORMAL LOW (ref 12.0–15.0)
MCH: 32.5 pg (ref 26.0–34.0)
MCHC: 34.8 g/dL (ref 30.0–36.0)
MCV: 93.4 fL (ref 80.0–100.0)
Platelets: 151 10*3/uL (ref 150–400)
RBC: 3.63 MIL/uL — ABNORMAL LOW (ref 3.87–5.11)
RDW: 12.6 % (ref 11.5–15.5)
WBC: 19.6 10*3/uL — ABNORMAL HIGH (ref 4.0–10.5)
nRBC: 0 % (ref 0.0–0.2)

## 2018-07-11 NOTE — Progress Notes (Signed)
[name] was referred for history of depression/anxiety.  * Referral screened out by Clinical Social Worker because none of the following criteria appear to apply:  -History of anxiety/depression during this pregnancy, or of post-partum depression following prior delivery. -Diagnosis of anxiety and/or depression within last 3 years OR * [name]'s symptoms currently being treated with medication and/or therapy. [name] started on Zoloft on 12/27 and is currently taking Zoloft here in the hospital.  Please contact the Clinical Social Worker if needs arise, by [name] request, or if [name] scores greater than 9/yes to question 10 on Edinburgh Postpartum Depression Screen.  Krista Lawrence, LCSWA  [facility] [phone]   

## 2018-07-11 NOTE — Lactation Note (Signed)
This note was copied from a baby's chart. Lactation Consultation Note  Patient Name: Krista Lawrence [id number] Date: 07/11/2018 Reason for consult: Initial assessment;1st time breastfeeding;Term;Nipple pain/trauma P1, 9 hour female  infant . Per mom infant did not latch well during L&D. LC notice  mom has a nipple stipe on her  right breast. Mom felt she doesn't; have any breast milk. LC assisted mom in hand expression.infant was given 1 ml of colostrum by spoon. After few attempts mom latched infant with 20 mm NS, infant sustained latch for 10 minutes. Mom was pleased with feeding,  LC reminded mom infant's nose should touch breast, mouth should have a wide gape. Mom used DEBP and mom knows to pump every 3 hours on initial setting for 15 minutes. Mom was given a hand pump and breast shells by nurse mom understands how to use them. LC discussed I & O. Mom knows to call Nurse or LC if she  has any questions ,concerns or need assistance with latching infant to breast. .INF has any Mom made aware of O/P services, breastfeeding support groups, community resources, and our phone # for post-discharge questions.    Maternal Data Formula Feeding for Exclusion: No Has patient been taught Hand Expression?: Yes(Mom hand expressed1 ml of colostrum given to infant by spoon) Does the patient have breastfeeding experience prior to this delivery?: No  Feeding Feeding Type: Breast Fed  LATCH Score Latch: Repeated attempts needed to sustain latch, nipple held in mouth throughout feeding, stimulation needed to elicit sucking reflex.  Audible Swallowing: A few with stimulation  Type of Nipple: Flat  Comfort (Breast/Nipple): Filling, red/small blisters or bruises, mild/mod discomfort  Hold (Positioning): Assistance needed to correctly position infant at breast and maintain latch.  LATCH Score: 5  Interventions Interventions: Breast feeding basics reviewed;Assisted with latch;Skin to  skin;Breast massage;Hand express;Pre-pump if needed;Breast compression;Support pillows;Position options;Expressed milk;Shells;DEBP  Lactation Tools Discussed/Used WIC Program: Yes Pump Review: Setup, frequency, and cleaning;Milk Storage Initiated by:: Danelle Earthly, IBCLC) Date initiated:: 07/11/18   Consult Status Consult Status: Follow-up Date: 07/12/18 Follow-up type: In-patient    Danelle Earthly 07/11/2018, 5:49 AM

## 2018-07-11 NOTE — Lactation Note (Signed)
This note was copied from a baby's chart. Lactation Consultation Note  Patient Name: Krista Lawrence [id number] Date: 07/11/2018 Reason for consult: Follow-up assessment;Difficult latch;Term;Primapara;1st time breastfeeding  P1 mother whose infant is now 10 hours old.    Baby was awakening so I offered to assist with latching and mother accepted.  Mother's breasts are soft and non tender and nipples are everted but short shafted.  Breast tissue is compressible.  Mother has a hand pump and breast shells at bedside and has been instructed in the use of these tools.  She also has a NS but does not wish to use it.  I explained that we can try latching without the NS and this made her happy.  Mother was able to express a drop of colostrum from the left breast which I finger fed to baby.  Assisted to latch on the left breast in the cross cradle hold without difficulty.  Baby had a wide mouth and flanged lips.  Once at the breast he was not interested in sucking.  Gentle stimulation performed and breast compressions completed.  He took a couple of sucks and became agitated.  Burped him and latched a second time.  Again, he latched easily but was not interested in sucking more than a few times.  Mother denied pain with latching.  Suggested she pump and reviewed pumping immediately after attempting to feed.  Mother verbalized understanding and will use her DEBP.  She knows to feed back any EBM she obtains to baby.    Mother will call for latch assistance as needed.  RN updated.   Maternal Data Formula Feeding for Exclusion: No Has patient been taught Hand Expression?: Yes Does the patient have breastfeeding experience prior to this delivery?: No  Feeding Feeding Type: Breast Fed  LATCH Score Latch: Repeated attempts needed to sustain latch, nipple held in mouth throughout feeding, stimulation needed to elicit sucking reflex.  Audible Swallowing: None  Type of Nipple: Everted at rest and  after stimulation(short shafted bilaterally)  Comfort (Breast/Nipple): Soft / non-tender  Hold (Positioning): Assistance needed to correctly position infant at breast and maintain latch.  LATCH Score: 6  Interventions Interventions: Breast feeding basics reviewed;Assisted with latch;Skin to skin;Breast massage;Hand express;Breast compression;Position options;Support pillows;Adjust position;DEBP  Lactation Tools Discussed/Used WIC Program: Yes Initiated by:: Already initiated   Consult Status Consult Status: Follow-up Date: 07/12/18 Follow-up type: In-patient    Krista Lawrence R Krista Lawrence 07/11/2018, 4:05 PM

## 2018-07-11 NOTE — Anesthesia Postprocedure Evaluation (Signed)
Anesthesia Post Note  Patient: Krista Lawrence  Procedure(s) Performed: CESAREAN SECTION (N/A )     Patient location during evaluation: PACU Anesthesia Type: General Level of consciousness: awake and alert Pain management: pain level controlled Vital Signs Assessment: post-procedure vital signs reviewed and stable Respiratory status: spontaneous breathing, nonlabored ventilation and respiratory function stable Cardiovascular status: blood pressure returned to baseline and stable Postop Assessment: no apparent nausea or vomiting Anesthetic complications: no    Last Vitals:  Vitals:   07/10/18 2220 07/10/18 2225  BP:    Pulse: 66 72  Resp: 18 (!) 22  Temp:    SpO2:      Last Pain:  Vitals:   07/10/18 2215  TempSrc:   PainSc: 0-No pain   Pain Goal: Patients Stated Pain Goal: 5 (07/10/18 0730)                 Lowella Curb

## 2018-07-11 NOTE — Anesthesia Postprocedure Evaluation (Signed)
Anesthesia Post Note  Patient: Krista Lawrence  Procedure(s) Performed: CESAREAN SECTION (N/A )     Patient location during evaluation: Mother Baby Anesthesia Type: Epidural Level of consciousness: awake and alert and oriented Pain management: satisfactory to patient Vital Signs Assessment: post-procedure vital signs reviewed and stable Respiratory status: respiratory function stable Cardiovascular status: stable Postop Assessment: no headache, no backache, epidural receding, patient able to bend at knees, no signs of nausea or vomiting and adequate PO intake Anesthetic complications: no    Last Vitals:  Vitals:   07/11/18 0330 07/11/18 0800  BP: 105/61   Pulse: 64   Resp: 16 18  Temp: 36.9 C 36.8 C  SpO2: 96% 97%    Last Pain:  Vitals:   07/11/18 1036  TempSrc:   PainSc: 0-No pain   Pain Goal: Patients Stated Pain Goal: 5 (07/10/18 0730)                 Donnalee Curry Hristova

## 2018-07-11 NOTE — Anesthesia Preprocedure Evaluation (Addendum)
Anesthesia Evaluation    Airway Mallampati: II  TM Distance: >3 FB Neck ROM: Full    Dental no notable dental hx.    Pulmonary former smoker,    Pulmonary exam normal breath sounds clear to auscultation       Cardiovascular Normal cardiovascular exam Rhythm:Regular Rate:Normal     Neuro/Psych    GI/Hepatic   Endo/Other    Renal/GU      Musculoskeletal   Abdominal   Peds  Hematology   Anesthesia Other Findings   Reproductive/Obstetrics                             Anesthesia Physical Anesthesia Plan  ASA: II  Anesthesia Plan: Epidural   Post-op Pain Management:    Induction: Intravenous  PONV Risk Score and Plan:   Airway Management Planned:   Additional Equipment:   Intra-op Plan:   Post-operative Plan: Extubation in OR  Informed Consent: I have reviewed the patients History and Physical, chart, labs and discussed the procedure including the risks, benefits and alternatives for the proposed anesthesia with the patient or authorized representative who has indicated his/her understanding and acceptance.     Dental advisory given  Plan Discussed with: CRNA  Anesthesia Plan Comments:         Anesthesia Quick Evaluation

## 2018-07-11 NOTE — Addendum Note (Signed)
Addendum  created 07/11/18 1043 by Elgie Congo, CRNA   Clinical Note Signed

## 2018-07-12 ENCOUNTER — Encounter (HOSPITAL_COMMUNITY): Payer: Self-pay | Admitting: *Deleted

## 2018-07-12 MED ORDER — IBUPROFEN 600 MG PO TABS: 600.0000 mg | ORAL_TABLET | Freq: Four times a day (QID) | ORAL | 1 refills | Status: DC | PRN

## 2018-07-12 MED ORDER — ACETAMINOPHEN 325 MG PO TABS: 650.0000 mg | ORAL_TABLET | ORAL | Status: DC | PRN

## 2018-07-12 MED ORDER — OXYCODONE HCL 5 MG PO TABS: 5.0000 mg | ORAL_TABLET | ORAL | 0 refills | Status: DC | PRN

## 2018-07-12 NOTE — Lactation Note (Signed)
This note was copied from a baby's chart. Lactation Consultation Note Baby 31 hrs old. Baby wanting to cluster feed. Mom stated he fed for a long time, then just licking. Then mom stated he wasn't feeding well. Mom was trying to BF w/o using NS. Breast not compressible. Breast filling. Rt. Nipple flat, Lt. Nipple very short shaft. Compresses flat.  Discussed why mom needs a NS at this time for feeding. Discussed transfer.  Mom stated baby would get on her breast and shake his head like he didn't want it but would cry act hungry. Explained baby can't feel nipple in his mouth that is why he's shaking his head. Encouraged cheeks to breast. Taught mom how to apply NS. Mom demonstrated application.   In football position latched baby well w/#20 NS. Mom had tenderness first couple of suckles then no pain. Dicussed chin tug, flange lips, positioning, support, newborn feeding habits, supply and demand.   Baby BF great, then sleeping. When removed from breast baby rooting, getting fussy. Placed back to breast, baby latched well. Colostrum in NS . Answered mom's questions.  Patient Name: Boy Desirey Veerkamp [id number] Date: 07/12/2018 Reason for consult: Follow-up assessment;1st time breastfeeding   Maternal Data    Feeding Feeding Type: Breast Fed  LATCH Score Latch: Grasps breast easily, tongue down, lips flanged, rhythmical sucking.  Audible Swallowing: A few with stimulation  Type of Nipple: Everted at rest and after stimulation(Rt. semi flat/Lt. very short shaft)  Comfort (Breast/Nipple): Filling, red/small blisters or bruises, mild/mod discomfort  Hold (Positioning): Assistance needed to correctly position infant at breast and maintain latch.  LATCH Score: 7  Interventions Interventions: Breast feeding basics reviewed;Adjust position;Assisted with latch;Support pillows;Skin to skin;Position options;Breast massage;Hand express;Pre-pump if needed;Breast  compression;Shells  Lactation Tools Discussed/Used Tools: Pump;Shells;Nipple Shields Nipple shield size: 20 Shell Type: Inverted Breast pump type: Double-Electric Breast Pump   Consult Status Consult Status: Follow-up Date: 07/13/18 Follow-up type: In-patient    Charyl Dancer 07/12/2018, 3:34 AM

## 2018-07-12 NOTE — Discharge Summary (Signed)
OB Discharge Summary     Patient Name: Krista Lawrence DOB: [birth year] MRN: [id number]  Date of admission: 07/09/2018 Delivering MD: Myna Hidalgo   Date of discharge: 07/12/2018  Admitting diagnosis: 41wks, induction, water broke Intrauterine pregnancy: [redacted]w[redacted]d     Secondary diagnosis:  Active Problems:   Labor and delivery, indication for care  Additional problems: None     Discharge diagnosis: Term Pregnancy Delivered                                                                                                Post partum procedures:None  Augmentation: cytotec/ pitocin / AROM  Complications: None  Hospital course:  Induction of Labor With Cesarean Section  28 y.o. yo G1P0 at [redacted]w[redacted]d was admitted to the hospital 07/09/2018 for induction of labor. Patient had a labor course significant for fetal intolerance of labor . The patient went for cesarean section due to Non-Reassuring FHR, and delivered a Viable infant,07/10/2018  Membrane Rupture Time/Date: 10:28 AM ,07/10/2018   Details of operation can be found in separate operative Note.  Patient had an uncomplicated postpartum course. She is ambulating, tolerating a regular diet, passing flatus, and urinating well.  Patient is discharged home in stable condition on 07/12/18.                                    Physical exam  Vitals:   07/11/18 0800 07/11/18 1230 07/11/18 1515 07/12/18 1504  BP:  105/64 122/69 114/72  Pulse:  (!) 55 62 71  Resp: 18 18 20 16   Temp: 98.2 F (36.8 C)  (!) 97.5 F (36.4 C) 98.4 F (36.9 C)  TempSrc: Oral  Oral Oral  SpO2: 97% 98% 100% 100%  Weight:      Height:       General: alert, cooperative and no distress Lochia: appropriate Uterine Fundus: firm Incision: Healing well with no significant drainage, Dressing is clean, dry, and intact DVT Evaluation: No evidence of DVT seen on physical exam. Labs: Lab Results  Component Value Date   WBC 19.6 (H) 07/11/2018   HGB 11.8 (L) 07/11/2018    HCT 33.9 (L) 07/11/2018   MCV 93.4 07/11/2018   PLT 151 07/11/2018   No flowsheet data found.  Discharge instruction: per After Visit Summary and "Baby and Me Booklet".  After visit meds:  Allergies as of 07/12/2018      Reactions   Amoxicillin Swelling, Rash   Did it involve swelling of the face/tongue/throat, SOB, or low BP? Yes Did it involve sudden or severe rash/hives, skin peeling, or any reaction on the inside of your mouth or nose? Yes Did you need to seek medical attention at a hospital or doctor's office? Unknown When did it last happen?22 year ago If all above answers are "NO", may proceed with cephalosporin use.   Augmentin [amoxicillin-pot Clavulanate] Swelling, Rash   Azithromycin Swelling, Rash   Lorabid [loracarbef] Swelling, Rash   Penicillins Swelling, Rash   Did it involve swelling of the face/tongue/throat, SOB,  or low BP? Yes Did it involve sudden or severe rash/hives, skin peeling, or any reaction on the inside of your mouth or nose? Yes Did you need to seek medical attention at a hospital or doctor's office? Unknown When did it last happen?22 year ago If all above answers are "NO", may proceed with cephalosporin use.      Medication List    TAKE these medications   acetaminophen 325 MG tablet Commonly known as:  TYLENOL Take 2 tablets (650 mg total) by mouth every 4 (four) hours as needed for mild pain (temperature > 101.5.).   ibuprofen 600 MG tablet Commonly known as:  ADVIL,MOTRIN Take 1 tablet (600 mg total) by mouth every 6 (six) hours as needed.   oxyCODONE 5 MG immediate release tablet Commonly known as:  Oxy IR/ROXICODONE Take 1-2 tablets (5-10 mg total) by mouth every 4 (four) hours as needed for moderate pain or severe pain.   prenatal multivitamin Tabs tablet Take 1 tablet by mouth daily at 12 noon.   sertraline 25 MG tablet Commonly known as:  ZOLOFT Take 25 mg by mouth daily.       Diet: routine diet  Activity:  Advance as tolerated. Pelvic rest for 6 weeks.   Outpatient follow up:2 weeks Follow up Appt:No future appointments. Follow up Visit:No follow-ups on file.  Postpartum contraception: Abstinence  Newborn Data: Live born female  Birth Weight: 7 lb 4.4 oz (3300 g) APGAR: 8, 9  Newborn Delivery   Birth date/time:  07/10/2018 20:30:00 Delivery type:  C-Section, Low Transverse Trial of labor:  Yes C-section categorization:  Primary     Baby Feeding: Breast Disposition:home with mother   07/12/2018 Gerald Leitz, MD

## 2018-07-25 NOTE — Addendum Note (Signed)
Addendum  created 07/25/18 1655 by Fabienne Bruns, RN   Procedure Navigator section edited

## 2018-07-30 NOTE — Addendum Note (Signed)
Addendum  created 07/30/18 1224 by Fabienne Bruns, RN   Intraprocedure Staff edited

## 2018-09-02 DIAGNOSIS — Z713 Dietary counseling and surveillance: Secondary | ICD-10-CM | POA: Diagnosis not present

## 2018-10-16 ENCOUNTER — Telehealth (HOSPITAL_COMMUNITY): Payer: Self-pay | Admitting: Lactation Services

## 2018-10-23 DIAGNOSIS — J4530 Mild persistent asthma, uncomplicated: Secondary | ICD-10-CM | POA: Diagnosis not present

## 2018-10-23 DIAGNOSIS — G43109 Migraine with aura, not intractable, without status migrainosus: Secondary | ICD-10-CM | POA: Diagnosis not present

## 2018-10-23 DIAGNOSIS — F411 Generalized anxiety disorder: Secondary | ICD-10-CM | POA: Diagnosis not present

## 2018-11-13 DIAGNOSIS — M9903 Segmental and somatic dysfunction of lumbar region: Secondary | ICD-10-CM | POA: Diagnosis not present

## 2018-11-13 DIAGNOSIS — M9901 Segmental and somatic dysfunction of cervical region: Secondary | ICD-10-CM | POA: Diagnosis not present

## 2018-11-13 DIAGNOSIS — M545 Low back pain: Secondary | ICD-10-CM | POA: Diagnosis not present

## 2018-11-13 DIAGNOSIS — G44219 Episodic tension-type headache, not intractable: Secondary | ICD-10-CM | POA: Diagnosis not present

## 2018-12-26 ENCOUNTER — Ambulatory Visit
Admission: EM | Admit: 2018-12-26 | Discharge: 2018-12-26 | Disposition: A | Discharge status: Home / Self Care | Payer: BC Managed Care – PPO

## 2018-12-26 ENCOUNTER — Other Ambulatory Visit: Payer: Self-pay

## 2018-12-26 ENCOUNTER — Telehealth: Payer: BLUE CROSS/BLUE SHIELD

## 2018-12-26 DIAGNOSIS — K644 Residual hemorrhoidal skin tags: Secondary | ICD-10-CM

## 2018-12-26 DIAGNOSIS — K602 Anal fissure, unspecified: Secondary | ICD-10-CM

## 2018-12-26 DIAGNOSIS — R21 Rash and other nonspecific skin eruption: Secondary | ICD-10-CM

## 2018-12-26 MED ORDER — NITROGLYCERIN 0.4 % RE OINT: TOPICAL_OINTMENT | RECTAL | 0 refills | Status: DC

## 2018-12-26 MED ORDER — PRAMOXINE HCL (PERIANAL) 1 % EX FOAM: 1.0000 "application " | Freq: Three times a day (TID) | CUTANEOUS | 0 refills | Status: DC | PRN

## 2018-12-26 NOTE — ED Provider Notes (Signed)
[facility]   [id number] 12/26/18 Arrival Time: 1503  CC: Anal itching  SUBJECTIVE:  Krista Lawrence is a 28 y.o. female who presents with complaint of anal itching x 1 month.  Denies a precipitating event, trauma.  Had a baby in March of this year.  Describes symptoms as persistent itchy, and uncomfortable.  Worse with bowel movements.  Has tried multiple OTC medications including preparation H, sitz baths, stool softeners, lidocaine jelly, and Vaseline without relief.  Denies similar symptoms in the past.  Has approximately 3 BM daily.  Looser stools recently with taking a stool softener, but otherwise normal.    Denies fever, chills, nausea, vomiting, abdominal pain, chest pain, SOB, diarrhea, constipation, hematochezia, melena, dysuria, difficulty urinating, increased frequency or urgency, flank pain, loss of bowel or bladder function, vaginal discharge, vaginal odor, vaginal bleeding, dyspareunia, pelvic pain.     Denies concern for STDs.  In monogamous relationship with husband of 1 year.  Denies hx of herpes, and has been tested for STDs in the past that were negative.    No LMP recorded (lmp unknown).  ROS: As per HPI.  All other pertinent ROS negative.     Past Medical History:  Diagnosis Date  . Anxiety   . Asthma   . Headache    Past Surgical History:  Procedure Laterality Date  . CESAREAN SECTION N/A 07/10/2018   Procedure: CESAREAN SECTION;  Surgeon: Myna Hidalgozan, Jennifer, DO;  Location: [facility];  Service: Obstetrics;  Laterality: N/A;  . TONSILLECTOMY     Allergies  Allergen Reactions  . Amoxicillin Swelling and Rash    Did it involve swelling of the face/tongue/throat, SOB, or low BP? Yes Did it involve sudden or severe rash/hives, skin peeling, or any reaction on the inside of your mouth or nose? Yes Did you need to seek medical attention at a hospital or doctor's office? Unknown When did it last happen?22 year ago If all above answers are "NO",  may proceed with cephalosporin use.  . Augmentin [Amoxicillin-Pot Clavulanate] Swelling and Rash  . Azithromycin Swelling and Rash  . Lorabid [Loracarbef] Swelling and Rash  . Penicillins Swelling and Rash    Did it involve swelling of the face/tongue/throat, SOB, or low BP? Yes Did it involve sudden or severe rash/hives, skin peeling, or any reaction on the inside of your mouth or nose? Yes Did you need to seek medical attention at a hospital or doctor's office? Unknown When did it last happen?22 year ago If all above answers are "NO", may proceed with cephalosporin use.    No current facility-administered medications on file prior to encounter.    Current Outpatient Medications on File Prior to Encounter  Medication Sig Dispense Refill  . norethindrone (MICRONOR) 0.35 MG tablet Take 1 tablet by mouth daily.    Marland Kitchen. acetaminophen (TYLENOL) 325 MG tablet Take 2 tablets (650 mg total) by mouth every 4 (four) hours as needed for mild pain (temperature > 101.5.).    Marland Kitchen. ibuprofen (ADVIL,MOTRIN) 600 MG tablet Take 1 tablet (600 mg total) by mouth every 6 (six) hours as needed. 30 tablet 1  . Prenatal Vit-Fe Fumarate-FA (PRENATAL MULTIVITAMIN) TABS tablet Take 1 tablet by mouth daily at 12 noon.    . sertraline (ZOLOFT) 25 MG tablet Take 25 mg by mouth daily.     Social History   Socioeconomic History  . Marital status: Married    Spouse name: Not on file  . Number of children: Not on file  .  Years of education: Not on file  . Highest education level: Not on file  Occupational History  . Not on file  Social Needs  . Financial resource strain: Not hard at all  . Food insecurity    Worry: Never true    Inability: Never true  . Transportation needs    Medical: No    Non-medical: Not on file  Tobacco Use  . Smoking status: Former Games developer  . Smokeless tobacco: Never Used  . Tobacco comment: occ  Substance and Sexual Activity  . Alcohol use: Not Currently  . Drug use: Not Currently   . Sexual activity: Not Currently  Lifestyle  . Physical activity    Days per week: Not on file    Minutes per session: Not on file  . Stress: Only a little  Relationships  . Social Musician on phone: Not on file    Gets together: Not on file    Attends religious service: Not on file    Active member of club or organization: Not on file    Attends meetings of clubs or organizations: Not on file    Relationship status: Not on file  . Intimate partner violence    Fear of current or ex partner: No    Emotionally abused: No    Physically abused: No    Forced sexual activity: No  Other Topics Concern  . Not on file  Social History Narrative  . Not on file   Family History  Problem Relation Age of Onset  . Bipolar disorder Mother   . Rheum arthritis Mother   . Lupus Sister   . Lung cancer Father   . Heart disease Father   . Pulmonary fibrosis Father   . Hypertension Father   . Autism Brother   . Hypertension Paternal Aunt   . Diabetes Paternal Aunt   . Throat cancer Paternal Uncle   . Colon cancer Paternal Uncle   . Liver cancer Paternal Uncle   . Hypertension Paternal Uncle   . Diabetes Paternal Uncle   . Skin cancer Maternal Grandmother   . Depression Maternal Grandmother   . Dementia Maternal Grandfather   . Heart failure Paternal Grandmother   . Hypertension Paternal Grandmother   . Depression Paternal Grandmother   . Hypertension Paternal Grandfather      OBJECTIVE:  Vitals:   12/26/18 1517  BP: 118/70  Pulse: 77  Resp: 20  Temp: 98.5 F (36.9 C)  SpO2: 97%    General appearance: Alert; NAD HEENT: NCAT.  PERRL, EOMI grossly; Oropharynx clear.  Lungs: clear to auscultation bilaterally without adventitious breath sounds Heart: regular rate and rhythm.   Abdomen: soft, non-distended; normal active bowel sounds; non-tender to light and deep palpation; nontender at McBurney's point; no guarding Rectal: Amy Stone RN present as chaperone:  External exam positive for residual hemorrhoidal skin tag in 6 o'clock position and fissure present in 12 o'clock position, erythematous linear maculopapular rash approximately 4-5 cm in length over RT buttocks (patient speculates she may have scratched herself); TTP over 12 and 6 o'clock positions; Internal: Mild discomfort; no masses or internal hemorrhoids appreciated; hemoccult negative   Extremities: no edema; symmetrical with no gross deformities Skin: warm and dry Neurologic: normal gait Psychological: alert and cooperative; normal mood and affect  LABS: Hemoccult negative  ASSESSMENT & PLAN:  1. Hemorrhoidal skin tag   2. Anal fissure   3. Localized maculopapular rash     Meds ordered this  encounter  Medications  . Nitroglycerin 0.4 % OINT    Sig: Apply 1 inch of 0.4% ointment (375 mg of ointment equivalent to 1.5 mg of nitroglycerin) INTRA-ANALLY every 12 hours for up to 3 weeks    Dispense:  30 g    Refill:  0    Order Specific Question:   Supervising Provider    Answer:   Raylene Everts [[id number]]  . pramoxine (PROCTOFOAM) 1 % foam    Sig: Place 1 application rectally 3 (three) times daily as needed for anal itching.    Dispense:  15 g    Refill:  0    Order Specific Question:   Supervising Provider    Answer:   Raylene Everts [[id number]]    Perform sitz water baths Eat a diet high if fiber and drink plenty of water Continue with stool softener Prescribed proctofoam for residual hemorrhoids Nitroglycerin ointment for anal fissures Use medication as prescribed Follow up with GI for further evaluation and management Return or go to the ER if you have any new or worsening symptoms fever, chills, nausea, vomiting, abdominal pain, blood in stool, worsening symptoms despite treatments, etc...  Patient will return tomorrow for HSV swab of rash on buttocks.  Low suspicious for HSV.    Reviewed expectations re: course of current medical issues. Questions answered.  Outlined signs and symptoms indicating need for more acute intervention. Patient verbalized understanding. After Visit Summary given.   Lestine Box, PA-C 12/26/18 1617

## 2018-12-26 NOTE — Discharge Instructions (Addendum)
Perform sitz water baths Eat a diet high if fiber and drink plenty of water Continue with stool softener Prescribed proctofoam for residual hemorrhoids Nitroglycerin ointment for anal fissures Use medication as prescribed Follow up with GI for further evaluation and management Return or go to the ER if you have any new or worsening symptoms fever, chills, nausea, vomiting, abdominal pain, blood in stool, worsening symptoms despite treatments, etc..Marland Kitchen

## 2018-12-26 NOTE — ED Notes (Signed)
Occult blood test resulted, Results are Negative.

## 2018-12-27 ENCOUNTER — Telehealth: Payer: Self-pay | Admitting: Emergency Medicine

## 2018-12-27 DIAGNOSIS — M9903 Segmental and somatic dysfunction of lumbar region: Secondary | ICD-10-CM | POA: Diagnosis not present

## 2018-12-27 DIAGNOSIS — G44219 Episodic tension-type headache, not intractable: Secondary | ICD-10-CM | POA: Diagnosis not present

## 2018-12-27 DIAGNOSIS — M545 Low back pain: Secondary | ICD-10-CM | POA: Diagnosis not present

## 2018-12-27 DIAGNOSIS — M9901 Segmental and somatic dysfunction of cervical region: Secondary | ICD-10-CM | POA: Diagnosis not present

## 2018-12-27 NOTE — Telephone Encounter (Signed)
Pt presents for HSV swab.  Did not have HSV swab available in office when patient came in yesterday.

## 2018-12-29 LAB — HSV CULTURE AND TYPING

## 2018-12-30 ENCOUNTER — Encounter: Payer: Self-pay | Admitting: Gastroenterology

## 2019-01-01 DIAGNOSIS — K602 Anal fissure, unspecified: Secondary | ICD-10-CM | POA: Diagnosis not present

## 2019-01-01 DIAGNOSIS — L290 Pruritus ani: Secondary | ICD-10-CM | POA: Diagnosis not present

## 2019-01-01 DIAGNOSIS — L309 Dermatitis, unspecified: Secondary | ICD-10-CM | POA: Diagnosis not present

## 2019-01-14 DIAGNOSIS — L290 Pruritus ani: Secondary | ICD-10-CM | POA: Diagnosis not present

## 2019-01-23 DIAGNOSIS — Z13220 Encounter for screening for lipoid disorders: Secondary | ICD-10-CM | POA: Diagnosis not present

## 2019-01-23 DIAGNOSIS — Z0000 Encounter for general adult medical examination without abnormal findings: Secondary | ICD-10-CM | POA: Diagnosis not present

## 2019-01-24 DIAGNOSIS — L290 Pruritus ani: Secondary | ICD-10-CM | POA: Diagnosis not present

## 2019-01-24 DIAGNOSIS — K602 Anal fissure, unspecified: Secondary | ICD-10-CM | POA: Diagnosis not present

## 2019-01-24 DIAGNOSIS — L309 Dermatitis, unspecified: Secondary | ICD-10-CM | POA: Diagnosis not present

## 2019-02-03 ENCOUNTER — Ambulatory Visit: Payer: BC Managed Care – PPO | Admitting: Gastroenterology

## 2019-02-03 DIAGNOSIS — K602 Anal fissure, unspecified: Secondary | ICD-10-CM | POA: Diagnosis not present

## 2019-02-03 DIAGNOSIS — L309 Dermatitis, unspecified: Secondary | ICD-10-CM | POA: Diagnosis not present

## 2019-02-03 DIAGNOSIS — L290 Pruritus ani: Secondary | ICD-10-CM | POA: Diagnosis not present

## 2019-02-03 DIAGNOSIS — Z713 Dietary counseling and surveillance: Secondary | ICD-10-CM | POA: Diagnosis not present

## 2019-02-19 DIAGNOSIS — N941 Unspecified dyspareunia: Secondary | ICD-10-CM | POA: Diagnosis not present

## 2019-02-19 DIAGNOSIS — M6281 Muscle weakness (generalized): Secondary | ICD-10-CM | POA: Diagnosis not present

## 2019-02-19 DIAGNOSIS — M62838 Other muscle spasm: Secondary | ICD-10-CM | POA: Diagnosis not present

## 2019-02-26 DIAGNOSIS — M62838 Other muscle spasm: Secondary | ICD-10-CM | POA: Diagnosis not present

## 2019-02-26 DIAGNOSIS — N941 Unspecified dyspareunia: Secondary | ICD-10-CM | POA: Diagnosis not present

## 2019-02-26 DIAGNOSIS — M6281 Muscle weakness (generalized): Secondary | ICD-10-CM | POA: Diagnosis not present

## 2019-03-05 DIAGNOSIS — M62838 Other muscle spasm: Secondary | ICD-10-CM | POA: Diagnosis not present

## 2019-03-05 DIAGNOSIS — M6281 Muscle weakness (generalized): Secondary | ICD-10-CM | POA: Diagnosis not present

## 2019-03-10 DIAGNOSIS — G44219 Episodic tension-type headache, not intractable: Secondary | ICD-10-CM | POA: Diagnosis not present

## 2019-03-10 DIAGNOSIS — M546 Pain in thoracic spine: Secondary | ICD-10-CM | POA: Diagnosis not present

## 2019-03-10 DIAGNOSIS — M9902 Segmental and somatic dysfunction of thoracic region: Secondary | ICD-10-CM | POA: Diagnosis not present

## 2019-03-10 DIAGNOSIS — M9901 Segmental and somatic dysfunction of cervical region: Secondary | ICD-10-CM | POA: Diagnosis not present

## 2019-03-12 DIAGNOSIS — N941 Unspecified dyspareunia: Secondary | ICD-10-CM | POA: Diagnosis not present

## 2019-03-12 DIAGNOSIS — M62838 Other muscle spasm: Secondary | ICD-10-CM | POA: Diagnosis not present

## 2019-03-12 DIAGNOSIS — M6281 Muscle weakness (generalized): Secondary | ICD-10-CM | POA: Diagnosis not present

## 2019-03-26 DIAGNOSIS — N941 Unspecified dyspareunia: Secondary | ICD-10-CM | POA: Diagnosis not present

## 2019-03-26 DIAGNOSIS — M62838 Other muscle spasm: Secondary | ICD-10-CM | POA: Diagnosis not present

## 2019-03-26 DIAGNOSIS — M6281 Muscle weakness (generalized): Secondary | ICD-10-CM | POA: Diagnosis not present

## 2019-04-02 DIAGNOSIS — M6281 Muscle weakness (generalized): Secondary | ICD-10-CM | POA: Diagnosis not present

## 2019-04-02 DIAGNOSIS — M62838 Other muscle spasm: Secondary | ICD-10-CM | POA: Diagnosis not present

## 2019-04-02 DIAGNOSIS — N941 Unspecified dyspareunia: Secondary | ICD-10-CM | POA: Diagnosis not present

## 2019-04-04 DIAGNOSIS — M9901 Segmental and somatic dysfunction of cervical region: Secondary | ICD-10-CM | POA: Diagnosis not present

## 2019-04-04 DIAGNOSIS — M9902 Segmental and somatic dysfunction of thoracic region: Secondary | ICD-10-CM | POA: Diagnosis not present

## 2019-04-04 DIAGNOSIS — G44219 Episodic tension-type headache, not intractable: Secondary | ICD-10-CM | POA: Diagnosis not present

## 2019-04-04 DIAGNOSIS — M546 Pain in thoracic spine: Secondary | ICD-10-CM | POA: Diagnosis not present

## 2019-04-07 DIAGNOSIS — M9902 Segmental and somatic dysfunction of thoracic region: Secondary | ICD-10-CM | POA: Diagnosis not present

## 2019-04-07 DIAGNOSIS — M546 Pain in thoracic spine: Secondary | ICD-10-CM | POA: Diagnosis not present

## 2019-04-07 DIAGNOSIS — M9901 Segmental and somatic dysfunction of cervical region: Secondary | ICD-10-CM | POA: Diagnosis not present

## 2019-04-07 DIAGNOSIS — G44219 Episodic tension-type headache, not intractable: Secondary | ICD-10-CM | POA: Diagnosis not present

## 2019-04-09 DIAGNOSIS — M62838 Other muscle spasm: Secondary | ICD-10-CM | POA: Diagnosis not present

## 2019-04-09 DIAGNOSIS — N941 Unspecified dyspareunia: Secondary | ICD-10-CM | POA: Diagnosis not present

## 2019-04-09 DIAGNOSIS — M6281 Muscle weakness (generalized): Secondary | ICD-10-CM | POA: Diagnosis not present

## 2019-04-14 DIAGNOSIS — M9902 Segmental and somatic dysfunction of thoracic region: Secondary | ICD-10-CM | POA: Diagnosis not present

## 2019-04-14 DIAGNOSIS — G44219 Episodic tension-type headache, not intractable: Secondary | ICD-10-CM | POA: Diagnosis not present

## 2019-04-14 DIAGNOSIS — M546 Pain in thoracic spine: Secondary | ICD-10-CM | POA: Diagnosis not present

## 2019-04-14 DIAGNOSIS — M9901 Segmental and somatic dysfunction of cervical region: Secondary | ICD-10-CM | POA: Diagnosis not present

## 2019-04-21 DIAGNOSIS — M9901 Segmental and somatic dysfunction of cervical region: Secondary | ICD-10-CM | POA: Diagnosis not present

## 2019-04-21 DIAGNOSIS — M546 Pain in thoracic spine: Secondary | ICD-10-CM | POA: Diagnosis not present

## 2019-04-21 DIAGNOSIS — M9902 Segmental and somatic dysfunction of thoracic region: Secondary | ICD-10-CM | POA: Diagnosis not present

## 2019-04-21 DIAGNOSIS — G44219 Episodic tension-type headache, not intractable: Secondary | ICD-10-CM | POA: Diagnosis not present

## 2019-04-22 ENCOUNTER — Ambulatory Visit
Admission: EM | Admit: 2019-04-22 | Discharge: 2019-04-22 | Disposition: A | Discharge status: Home / Self Care | Payer: BC Managed Care – PPO | Attending: Emergency Medicine | Admitting: Emergency Medicine

## 2019-04-22 DIAGNOSIS — Z711 Person with feared health complaint in whom no diagnosis is made: Secondary | ICD-10-CM | POA: Diagnosis not present

## 2019-04-22 DIAGNOSIS — Z20822 Contact with and (suspected) exposure to covid-19: Secondary | ICD-10-CM

## 2019-04-22 DIAGNOSIS — Z20828 Contact with and (suspected) exposure to other viral communicable diseases: Secondary | ICD-10-CM | POA: Diagnosis not present

## 2019-04-22 NOTE — ED Triage Notes (Signed)
Here for covid test, no exposure, needs for travel to family

## 2019-04-22 NOTE — Discharge Instructions (Addendum)

## 2019-04-22 NOTE — ED Provider Notes (Signed)
Annex   [id number] 04/22/19 Arrival Time: 74   CC: COVID test  SUBJECTIVE: History from: patient.  Krista Lawrence is a 28 y.o. female who presents for COVID testing.  Denies sick exposure to COVID, flu or strep.  Denies recent travel.  Requests COVID test prior to family getting together for christmas.  Denies aggravating or alleviating symptoms.  Denies previous COVID infection.   Denies fever, chills, fatigue, nasal congestion, rhinorrhea, sore throat, cough, SOB, wheezing, chest pain, nausea, vomiting, changes in bowel or bladder habits.    ROS: As per HPI.  All other pertinent ROS negative.     Past Medical History:  Diagnosis Date  . Anxiety   . Asthma   . Headache    Past Surgical History:  Procedure Laterality Date  . CESAREAN SECTION N/A 07/10/2018   Procedure: CESAREAN SECTION;  Surgeon: Janyth Pupa, DO;  Location: [facility];  Service: Obstetrics;  Laterality: N/A;  . TONSILLECTOMY     Allergies  Allergen Reactions  . Amoxicillin Swelling and Rash    Did it involve swelling of the face/tongue/throat, SOB, or low BP? Yes Did it involve sudden or severe rash/hives, skin peeling, or any reaction on the inside of your mouth or nose? Yes Did you need to seek medical attention at a hospital or doctor's office? Unknown When did it last happen?22 year ago If all above answers are "NO", may proceed with cephalosporin use.  . Augmentin [Amoxicillin-Pot Clavulanate] Swelling and Rash  . Azithromycin Swelling and Rash  . Lorabid [Loracarbef] Swelling and Rash  . Penicillins Swelling and Rash    Did it involve swelling of the face/tongue/throat, SOB, or low BP? Yes Did it involve sudden or severe rash/hives, skin peeling, or any reaction on the inside of your mouth or nose? Yes Did you need to seek medical attention at a hospital or doctor's office? Unknown When did it last happen?22 year ago If all above answers are "NO", may proceed  with cephalosporin use.    No current facility-administered medications on file prior to encounter.   Current Outpatient Medications on File Prior to Encounter  Medication Sig Dispense Refill  . acetaminophen (TYLENOL) 325 MG tablet Take 2 tablets (650 mg total) by mouth every 4 (four) hours as needed for mild pain (temperature > 101.5.).    Marland Kitchen ibuprofen (ADVIL,MOTRIN) 600 MG tablet Take 1 tablet (600 mg total) by mouth every 6 (six) hours as needed. 30 tablet 1  . Nitroglycerin 0.4 % OINT Apply 1 inch of 0.4% ointment (375 mg of ointment equivalent to 1.5 mg of nitroglycerin) INTRA-ANALLY every 12 hours for up to 3 weeks 30 g 0  . norethindrone (MICRONOR) 0.35 MG tablet Take 1 tablet by mouth daily.    . pramoxine (PROCTOFOAM) 1 % foam Place 1 application rectally 3 (three) times daily as needed for anal itching. 15 g 0  . Prenatal Vit-Fe Fumarate-FA (PRENATAL MULTIVITAMIN) TABS tablet Take 1 tablet by mouth daily at 12 noon.    . sertraline (ZOLOFT) 25 MG tablet Take 25 mg by mouth daily.     Social History   Socioeconomic History  . Marital status: Married    Spouse name: Not on file  . Number of children: Not on file  . Years of education: Not on file  . Highest education level: Not on file  Occupational History  . Not on file  Tobacco Use  . Smoking status: Former Research scientist (life sciences)  . Smokeless tobacco: Never Used  .  Tobacco comment: occ  Substance and Sexual Activity  . Alcohol use: Not Currently  . Drug use: Not Currently  . Sexual activity: Not Currently  Other Topics Concern  . Not on file  Social History Narrative  . Not on file   Social Determinants of Health   Financial Resource Strain: Low Risk   . Difficulty of Paying Living Expenses: Not hard at all  Food Insecurity: No Food Insecurity  . Worried About Programme researcher, broadcasting/film/video in the Last Year: Never true  . Ran Out of Food in the Last Year: Never true  Transportation Needs: Unknown  . Lack of Transportation (Medical): No    . Lack of Transportation (Non-Medical): Not on file  Physical Activity:   . Days of Exercise per Week: Not on file  . Minutes of Exercise per Session: Not on file  Stress: No Stress Concern Present  . Feeling of Stress : Only a little  Social Connections:   . Frequency of Communication with Friends and Family: Not on file  . Frequency of Social Gatherings with Friends and Family: Not on file  . Attends Religious Services: Not on file  . Active Member of Clubs or Organizations: Not on file  . Attends Banker Meetings: Not on file  . Marital Status: Not on file  Intimate Partner Violence: Not At Risk  . Fear of Current or Ex-Partner: No  . Emotionally Abused: No  . Physically Abused: No  . Sexually Abused: No   Family History  Problem Relation Age of Onset  . Bipolar disorder Mother   . Rheum arthritis Mother   . Lupus Sister   . Lung cancer Father   . Heart disease Father   . Pulmonary fibrosis Father   . Hypertension Father   . Autism Brother   . Hypertension Paternal Aunt   . Diabetes Paternal Aunt   . Throat cancer Paternal Uncle   . Colon cancer Paternal Uncle   . Liver cancer Paternal Uncle   . Hypertension Paternal Uncle   . Diabetes Paternal Uncle   . Skin cancer Maternal Grandmother   . Depression Maternal Grandmother   . Dementia Maternal Grandfather   . Heart failure Paternal Grandmother   . Hypertension Paternal Grandmother   . Depression Paternal Grandmother   . Hypertension Paternal Grandfather     OBJECTIVE:  Vitals:   04/22/19 1335  BP: 106/68  Pulse: 67  Resp: 17  Temp: 98.3 F (36.8 C)  TempSrc: Oral  SpO2: 98%     General appearance: alert; well-appearing, nontoxic; speaking in full sentences and tolerating own secretions HEENT: NCAT; Ears: EACs clear, TMs pearly gray; Eyes: PERRL.  EOM grossly intact. Nose: nares patent without rhinorrhea, Throat: oropharynx clear, tonsils non erythematous or enlarged, uvula midline  Neck:  supple without LAD Lungs: unlabored respirations, symmetrical air entry; cough: absent; no respiratory distress; CTAB Heart: regular rate and rhythm.  Skin: warm and dry Psychological: alert and cooperative; normal mood and affect  ASSESSMENT & PLAN:  1. Worried well   2. Encounter for laboratory testing for COVID-19 virus    COVID testing ordered.  It will take between 5-7 days for test results.  Someone will contact you regarding abnormal results.    In the meantime: You should remain isolated in your home for 10 days from symptom onset AND greater than 72 hours after symptoms resolution (absence of fever without the use of fever-reducing medication and improvement in respiratory symptoms), whichever is longer  Get plenty of rest and push fluids Use OTC zyrtec for nasal congestion, runny nose, and/or sore throat Use OTC flonase for nasal congestion and runny nose Use medications daily for symptom relief Use OTC medications like ibuprofen or tylenol as needed fever or pain Call or go to the ED if you have any new or worsening symptoms such as fever, cough, shortness of breath, chest tightness, chest pain, turning blue, changes in mental status, etc...   Reviewed expectations re: course of current medical issues. Questions answered. Outlined signs and symptoms indicating need for more acute intervention. Patient verbalized understanding. After Visit Summary given.         Rennis HardingWurst, Carnell Beavers, PA-C 04/22/19 1408

## 2019-04-23 DIAGNOSIS — M62838 Other muscle spasm: Secondary | ICD-10-CM | POA: Diagnosis not present

## 2019-04-23 DIAGNOSIS — G44219 Episodic tension-type headache, not intractable: Secondary | ICD-10-CM | POA: Diagnosis not present

## 2019-04-23 DIAGNOSIS — M546 Pain in thoracic spine: Secondary | ICD-10-CM | POA: Diagnosis not present

## 2019-04-23 DIAGNOSIS — M9901 Segmental and somatic dysfunction of cervical region: Secondary | ICD-10-CM | POA: Diagnosis not present

## 2019-04-23 DIAGNOSIS — M9902 Segmental and somatic dysfunction of thoracic region: Secondary | ICD-10-CM | POA: Diagnosis not present

## 2019-04-23 DIAGNOSIS — M6281 Muscle weakness (generalized): Secondary | ICD-10-CM | POA: Diagnosis not present

## 2019-04-23 DIAGNOSIS — N941 Unspecified dyspareunia: Secondary | ICD-10-CM | POA: Diagnosis not present

## 2019-04-23 LAB — NOVEL CORONAVIRUS, NAA: SARS-CoV-2, NAA: NOT DETECTED

## 2019-05-23 DIAGNOSIS — L309 Dermatitis, unspecified: Secondary | ICD-10-CM | POA: Diagnosis not present

## 2019-06-09 DIAGNOSIS — Z01419 Encounter for gynecological examination (general) (routine) without abnormal findings: Secondary | ICD-10-CM | POA: Diagnosis not present

## 2019-07-18 ENCOUNTER — Ambulatory Visit: Payer: BC Managed Care – PPO | Admitting: Allergy & Immunology

## 2019-09-16 DIAGNOSIS — L308 Other specified dermatitis: Secondary | ICD-10-CM | POA: Diagnosis not present

## 2019-09-16 DIAGNOSIS — L02821 Furuncle of head [any part, except face]: Secondary | ICD-10-CM | POA: Diagnosis not present

## 2019-09-16 DIAGNOSIS — L728 Other follicular cysts of the skin and subcutaneous tissue: Secondary | ICD-10-CM | POA: Diagnosis not present

## 2019-09-30 DIAGNOSIS — R110 Nausea: Secondary | ICD-10-CM | POA: Diagnosis not present

## 2019-09-30 DIAGNOSIS — K625 Hemorrhage of anus and rectum: Secondary | ICD-10-CM | POA: Diagnosis not present

## 2019-09-30 DIAGNOSIS — A09 Infectious gastroenteritis and colitis, unspecified: Secondary | ICD-10-CM | POA: Diagnosis not present

## 2019-09-30 DIAGNOSIS — R109 Unspecified abdominal pain: Secondary | ICD-10-CM | POA: Diagnosis not present

## 2019-10-01 DIAGNOSIS — A09 Infectious gastroenteritis and colitis, unspecified: Secondary | ICD-10-CM | POA: Diagnosis not present

## 2019-10-03 DIAGNOSIS — A0472 Enterocolitis due to Clostridium difficile, not specified as recurrent: Secondary | ICD-10-CM | POA: Diagnosis not present

## 2019-10-03 DIAGNOSIS — A045 Campylobacter enteritis: Secondary | ICD-10-CM | POA: Diagnosis not present

## 2019-10-03 DIAGNOSIS — A044 Other intestinal Escherichia coli infections: Secondary | ICD-10-CM | POA: Diagnosis not present

## 2019-10-20 ENCOUNTER — Encounter (HOSPITAL_COMMUNITY): Payer: Self-pay | Admitting: Emergency Medicine

## 2019-10-20 ENCOUNTER — Emergency Department (HOSPITAL_COMMUNITY): Payer: BC Managed Care – PPO

## 2019-10-20 ENCOUNTER — Emergency Department (HOSPITAL_COMMUNITY)
Admission: EM | Admit: 2019-10-20 | Discharge: 2019-10-20 | Disposition: A | Discharge status: Home / Self Care | Payer: BC Managed Care – PPO | Attending: Emergency Medicine | Admitting: Emergency Medicine

## 2019-10-20 ENCOUNTER — Other Ambulatory Visit: Payer: Self-pay

## 2019-10-20 DIAGNOSIS — N201 Calculus of ureter: Secondary | ICD-10-CM | POA: Diagnosis not present

## 2019-10-20 DIAGNOSIS — R1011 Right upper quadrant pain: Secondary | ICD-10-CM

## 2019-10-20 DIAGNOSIS — Z87891 Personal history of nicotine dependence: Secondary | ICD-10-CM | POA: Insufficient documentation

## 2019-10-20 DIAGNOSIS — R1031 Right lower quadrant pain: Secondary | ICD-10-CM

## 2019-10-20 DIAGNOSIS — J45909 Unspecified asthma, uncomplicated: Secondary | ICD-10-CM | POA: Diagnosis not present

## 2019-10-20 DIAGNOSIS — N130 Hydronephrosis with ureteropelvic junction obstruction: Secondary | ICD-10-CM | POA: Insufficient documentation

## 2019-10-20 LAB — URINALYSIS, ROUTINE W REFLEX MICROSCOPIC
Bacteria, UA: NONE SEEN
Bilirubin Urine: NEGATIVE
Glucose, UA: NEGATIVE mg/dL
Ketones, ur: NEGATIVE mg/dL
Leukocytes,Ua: NEGATIVE
Nitrite: NEGATIVE
Protein, ur: NEGATIVE mg/dL
RBC / HPF: >50 RBC/hpf — ABNORMAL HIGH (ref 0–5)
Specific Gravity, Urine: 1.027 (ref 1.005–1.030)
pH: 6 (ref 5.0–8.0)

## 2019-10-20 LAB — COMPREHENSIVE METABOLIC PANEL
ALT: 19 U/L (ref 0–44)
AST: 22 U/L (ref 15–41)
Albumin: 4.4 g/dL (ref 3.5–5.0)
Alkaline Phosphatase: 93 U/L (ref 38–126)
Anion gap: 11 (ref 5–15)
BUN: 16 mg/dL (ref 6–20)
CO2: 25 mmol/L (ref 22–32)
Calcium: 9.1 mg/dL (ref 8.9–10.3)
Chloride: 101 mmol/L (ref 98–111)
Creatinine, Ser: 0.87 mg/dL (ref 0.44–1.00)
GFR calc Af Amer: >60 mL/min (ref >60)
GFR calc non Af Amer: >60 mL/min (ref >60)
Glucose, Bld: 107 mg/dL — ABNORMAL HIGH (ref 70–99)
Potassium: 3.9 mmol/L (ref 3.5–5.1)
Sodium: 137 mmol/L (ref 135–145)
Total Bilirubin: 0.6 mg/dL (ref 0.3–1.2)
Total Protein: 7 g/dL (ref 6.5–8.1)

## 2019-10-20 LAB — CBC
HCT: 42.6 % (ref 36.0–46.0)
Hemoglobin: 13.7 g/dL (ref 12.0–15.0)
MCH: 29.9 pg (ref 26.0–34.0)
MCHC: 32.2 g/dL (ref 30.0–36.0)
MCV: 93 fL (ref 80.0–100.0)
Platelets: 220 10*3/uL (ref 150–400)
RBC: 4.58 MIL/uL (ref 3.87–5.11)
RDW: 12.7 % (ref 11.5–15.5)
WBC: 4.5 10*3/uL (ref 4.0–10.5)
nRBC: 0 % (ref 0.0–0.2)

## 2019-10-20 LAB — DIFFERENTIAL
Abs Immature Granulocytes: 0.01 10*3/uL (ref 0.00–0.07)
Basophils Absolute: 0 10*3/uL (ref 0.0–0.1)
Basophils Relative: 0 %
Eosinophils Absolute: 0.1 10*3/uL (ref 0.0–0.5)
Eosinophils Relative: 1 %
Immature Granulocytes: 0 %
Lymphocytes Relative: 30 %
Lymphs Abs: 1.4 10*3/uL (ref 0.7–4.0)
Monocytes Absolute: 0.5 10*3/uL (ref 0.1–1.0)
Monocytes Relative: 10 %
Neutro Abs: 2.7 10*3/uL (ref 1.7–7.7)
Neutrophils Relative %: 59 %

## 2019-10-20 LAB — PREGNANCY, URINE: Preg Test, Ur: NEGATIVE

## 2019-10-20 MED ORDER — IBUPROFEN 600 MG PO TABS
600.0000 mg | ORAL_TABLET | Freq: Four times a day (QID) | ORAL | 0 refills | Status: AC | PRN
Start: 2019-10-20 — End: ?

## 2019-10-20 MED ORDER — ACETAMINOPHEN ER 650 MG PO TBCR: 650.0000 mg | EXTENDED_RELEASE_TABLET | Freq: Three times a day (TID) | ORAL | 0 refills | Status: AC | PRN

## 2019-10-20 MED ORDER — ONDANSETRON HCL 4 MG/2ML IJ SOLN
4.0000 mg | Freq: Once | INTRAMUSCULAR | Status: AC
  Administered 2019-10-20: 4 mg via INTRAVENOUS
  Filled 2019-10-20: qty 2

## 2019-10-20 MED ORDER — MORPHINE SULFATE (PF) 4 MG/ML IV SOLN
4.0000 mg | Freq: Once | INTRAVENOUS | Status: AC
  Administered 2019-10-20: 4 mg via INTRAVENOUS
  Filled 2019-10-20: qty 1

## 2019-10-20 MED ORDER — ONDANSETRON 8 MG PO TBDP: 8.0000 mg | ORAL_TABLET | Freq: Three times a day (TID) | ORAL | 0 refills | Status: AC | PRN

## 2019-10-20 MED ORDER — KETOROLAC TROMETHAMINE 30 MG/ML IJ SOLN
15.0000 mg | Freq: Once | INTRAMUSCULAR | Status: AC
  Administered 2019-10-20: 15 mg via INTRAVENOUS
  Filled 2019-10-20: qty 1

## 2019-10-20 MED ORDER — IOHEXOL 300 MG/ML  SOLN
100.0000 mL | Freq: Once | INTRAMUSCULAR | Status: AC | PRN
  Administered 2019-10-20: 100 mL via INTRAVENOUS

## 2019-10-20 MED ORDER — SODIUM CHLORIDE 0.9 % IV BOLUS
500.0000 mL | Freq: Once | INTRAVENOUS | Status: AC
  Administered 2019-10-20: 500 mL via INTRAVENOUS

## 2019-10-20 MED ORDER — OXYCODONE-ACETAMINOPHEN 5-325 MG PO TABS: 1.0000 | ORAL_TABLET | Freq: Three times a day (TID) | ORAL | 0 refills | Status: AC | PRN

## 2019-10-20 NOTE — ED Provider Notes (Signed)
Emergency Department Provider Note   I have reviewed the triage vital signs and the nursing notes.   HISTORY  Chief Complaint Abdominal Pain   HPI Krista Lawrence is a 29 y.o. female with past medical history reviewed below including surgical history with cesarean section in March 2020 presents to the emergency department with severe right lower quadrant pain. She states that symptoms began this morning. She initially thought these were severe cramping type pains but the pains have since turned into sharp, stabbing type pain radiating in the right lower quadrant to the back. She did recently complete vancomycin after developing C. difficile and finished her antibiotics yesterday. She denies fevers. No vomiting or diarrhea. She does continue to breast-feed. LMP ended June 9th but was not as heavy and her typical menses.    Past Medical History:  Diagnosis Date  . Anxiety   . Asthma   . Headache     Patient Active Problem List   Diagnosis Date Noted  . Labor and delivery, indication for care 07/09/2018    Past Surgical History:  Procedure Laterality Date  . CESAREAN SECTION N/A 07/10/2018   Procedure: CESAREAN SECTION;  Surgeon: Myna Hidalgo, DO;  Location: [facility];  Service: Obstetrics;  Laterality: N/A;  . TONSILLECTOMY      Allergies Amoxicillin, Augmentin [amoxicillin-pot clavulanate], Azithromycin, Lorabid [loracarbef], and Penicillins  Family History  Problem Relation Age of Onset  . Bipolar disorder Mother   . Rheum arthritis Mother   . Lupus Sister   . Lung cancer Father   . Heart disease Father   . Pulmonary fibrosis Father   . Hypertension Father   . Autism Brother   . Hypertension Paternal Aunt   . Diabetes Paternal Aunt   . Throat cancer Paternal Uncle   . Colon cancer Paternal Uncle   . Liver cancer Paternal Uncle   . Hypertension Paternal Uncle   . Diabetes Paternal Uncle   . Skin cancer Maternal Grandmother   . Depression Maternal  Grandmother   . Dementia Maternal Grandfather   . Heart failure Paternal Grandmother   . Hypertension Paternal Grandmother   . Depression Paternal Grandmother   . Hypertension Paternal Grandfather     Social History Social History   Tobacco Use  . Smoking status: Former Games developer  . Smokeless tobacco: Never Used  . Tobacco comment: occ  Vaping Use  . Vaping Use: Never used  Substance Use Topics  . Alcohol use: Not Currently  . Drug use: Not Currently    Review of Systems  Constitutional: No fever/chills Eyes: No visual changes. ENT: No sore throat. Cardiovascular: Denies chest pain. Respiratory: Denies shortness of breath. Gastrointestinal: Positive RLQ abdominal pain.  No nausea, no vomiting.  No diarrhea.  No constipation. Genitourinary: Negative for dysuria. Musculoskeletal: Negative for back pain. Skin: Negative for rash. Neurological: Negative for headaches, focal weakness or numbness.  10-point ROS otherwise negative.  ____________________________________________   PHYSICAL EXAM:  VITAL SIGNS: ED Triage Vitals [10/20/19 1219]  Enc Vitals Group     BP (!) 126/54     Pulse Rate 87     Resp 18     Temp 98.3 F (36.8 C)     Temp Source Oral     SpO2 97 %     Weight 130 lb (59 kg)     Height 5\' 3"  (1.6 m)   Constitutional: Alert and oriented. Well appearing and in no acute distress. Eyes: Conjunctivae are normal.  Head: Atraumatic.  Nose: No congestion/rhinnorhea. Mouth/Throat: Mucous membranes are moist.   Neck: No stridor. Cardiovascular: Normal rate, regular rhythm. Good peripheral circulation. Grossly normal heart sounds.   Respiratory: Normal respiratory effort.  No retractions. Lungs CTAB. Gastrointestinal: Soft with focal RUQ > RLQ tenderness with positive Murphy's sign. No rebound. No distention.  Musculoskeletal: No gross deformities of extremities. Neurologic:  Normal speech and language.  Skin:  Skin is warm, dry and intact. No rash  noted.   ____________________________________________   LABS (all labs ordered are listed, but only abnormal results are displayed)  Labs Reviewed  COMPREHENSIVE METABOLIC PANEL - Abnormal; Notable for the following components:      Result Value   Glucose, Bld 107 (*)    All other components within normal limits  URINALYSIS, ROUTINE W REFLEX MICROSCOPIC - Abnormal; Notable for the following components:   APPearance HAZY (*)    Hgb urine dipstick LARGE (*)    RBC / HPF >50 (*)    All other components within normal limits  CBC  PREGNANCY, URINE  DIFFERENTIAL   ____________________________________________  RADIOLOGY  CT ABDOMEN PELVIS W CONTRAST  Result Date: 10/20/2019 CLINICAL DATA:  29 year old female with right lower quadrant abdominal pain. EXAM: CT ABDOMEN AND PELVIS WITH CONTRAST TECHNIQUE: Multidetector CT imaging of the abdomen and pelvis was performed using the standard protocol following bolus administration of intravenous contrast. CONTRAST:  OMNIPAQUE IOHEXOL 300 MG/ML  SOLN COMPARISON:  Abdominal ultrasound dated 10/20/2019. FINDINGS: Lower chest: The visualized lung bases are clear. No intra-abdominal free air. Trace free fluid in the pelvis. Hepatobiliary: No focal liver abnormality is seen. No gallstones, gallbladder wall thickening, or biliary dilatation. Pancreas: Unremarkable. No pancreatic ductal dilatation or surrounding inflammatory changes. Spleen: Normal in size without focal abnormality. Adrenals/Urinary Tract: The adrenal glands are unremarkable. There is a 4 mm obstructing stone at the right ureterovesical junction. There is mild to moderate right hydronephroureter. The left kidney, left ureter appear unremarkable. The urinary bladder is collapsed Stomach/Bowel: There is no bowel obstruction or active inflammation. The appendix is normal. Vascular/Lymphatic: The abdominal aorta and IVC are unremarkable. No portal venous gas. There is no adenopathy.  Reproductive: The uterus is anteverted and grossly unremarkable. There is a 2 cm right ovarian corpus luteum or dominant follicle. Other: None Musculoskeletal: No acute or significant osseous findings. IMPRESSION: 1. A 4 mm right UVJ stone with mild to moderate right hydronephroureter. 2. No bowel obstruction. Normal appendix. Electronically Signed   By: Elgie Collard M.D.   On: 10/20/2019 15:52   US PELVIC COMPLETE W TRANSVAGINAL AND TORSION R/O  Result Date: 10/20/2019 CLINICAL DATA:  Right lower quadrant pain for 1 day. EXAM: TRANSABDOMINAL AND TRANSVAGINAL ULTRASOUND OF PELVIS DOPPLER ULTRASOUND OF OVARIES TECHNIQUE: Both transabdominal and transvaginal ultrasound examinations of the pelvis were performed. Transabdominal technique was performed for global imaging of the pelvis including uterus, ovaries, adnexal regions, and pelvic cul-de-sac. It was necessary to proceed with endovaginal exam following the transabdominal exam to visualize the ovaries. Color and duplex Doppler ultrasound was utilized to evaluate blood flow to the ovaries. COMPARISON:  None. FINDINGS: Uterus Measurements: 7.8 x 3.6 x 4.4 cm = volume: 64 mL. No fibroids or other mass visualized. Small volume fluid in the endocervical canal. Endometrium Thickness: 10 mm.  No focal abnormality visualized. Right ovary Measurements: 3.4 x 2.3 x 3.0 cm = volume: 12 mL. Normal appearance/no adnexal mass. Left ovary Measurements: 2.4 x 1.6 x 2.0 cm = volume: 4 mL. Normal appearance/no adnexal mass. Pulsed  Doppler evaluation of both ovaries demonstrates normal low-resistance arterial and venous waveforms. Other findings Trace pelvic free fluid, potentially physiologic. IMPRESSION: Small volume fluid in the endocervical canal and trace pelvic free fluid, otherwise unremarkable pelvic ultrasound. Electronically Signed   By: Sebastian Ache M.D.   On: 10/20/2019 14:16   US Abdomen Limited RUQ  Result Date: 10/20/2019 CLINICAL DATA:  Right upper  quadrant pain for 1 day. EXAM: ULTRASOUND ABDOMEN LIMITED RIGHT UPPER QUADRANT COMPARISON:  None. FINDINGS: Gallbladder: No gallstones or wall thickening visualized. No sonographic Murphy sign noted by sonographer. Common bile duct: Diameter: 2 mm Liver: No focal lesion identified. Within normal limits in parenchymal echogenicity. Portal vein is patent on color Doppler imaging with normal direction of blood flow towards the liver. Other: None. IMPRESSION: Unremarkable right upper quadrant ultrasound. Electronically Signed   By: Sebastian Ache M.D.   On: 10/20/2019 14:17    ____________________________________________   PROCEDURES  Procedure(s) performed:   Procedures  None ____________________________________________   INITIAL IMPRESSION / ASSESSMENT AND PLAN / ED COURSE  Pertinent labs & imaging results that were available during my care of the patient were reviewed by me and considered in my medical decision making (see chart for details).   Patient presents to the emergency room with acute onset right lower quadrant pain starting this morning. Given the description of pain and her relative discomfort I have ordered an emergent transvaginal ultrasound to assess her right ovary and evaluate for torsion. She does have an appendix and is also focally tender in the right upper quadrant with a positive Murphy sign. Her pain at rest is in the right lower quadrant, however. Plan for right upper quadrant ultrasound as well. Will treat with morphine and Zofran pending labs and imaging.   02:50 PM  TVUS without acute finding. Labs reviewed. Hb on UA. CT pending to r/o acute appy vs nephrolithiasis. Care transferred to Dr. Rhunette Croft to f/u on CT.  ____________________________________________  FINAL CLINICAL IMPRESSION(S) / ED DIAGNOSES  Final diagnoses:  RUQ abdominal pain  Calculus of ureterovesical junction (UVJ)     MEDICATIONS GIVEN DURING THIS VISIT:  Medications  sodium chloride 0.9 %  bolus 500 mL (0 mLs Intravenous Stopped 10/20/19 1531)  morphine 4 MG/ML injection 4 mg (4 mg Intravenous Given 10/20/19 1449)  ondansetron (ZOFRAN) injection 4 mg (4 mg Intravenous Given 10/20/19 1444)  iohexol (OMNIPAQUE) 300 MG/ML solution 100 mL (100 mLs Intravenous Contrast Given 10/20/19 1506)  ketorolac (TORADOL) 30 MG/ML injection 15 mg (15 mg Intravenous Given 10/20/19 1750)  ondansetron (ZOFRAN) injection 4 mg (4 mg Intravenous Given 10/20/19 1749)  ketorolac (TORADOL) 30 MG/ML injection 15 mg (15 mg Intravenous Given 10/20/19 1919)     NEW OUTPATIENT MEDICATIONS STARTED DURING THIS VISIT:  Discharge Medication List as of 10/20/2019  7:10 PM    START taking these medications   Details  acetaminophen (TYLENOL 8 HOUR) 650 MG CR tablet Take 1 tablet (650 mg total) by mouth every 8 (eight) hours as needed for pain or fever., Starting Mon 10/20/2019, Normal    ibuprofen (ADVIL) 600 MG tablet Take 1 tablet (600 mg total) by mouth every 6 (six) hours as needed., Starting Mon 10/20/2019, Normal    oxyCODONE-acetaminophen (PERCOCET/ROXICET) 5-325 MG tablet Take 1 tablet by mouth every 8 (eight) hours as needed for severe pain., Starting Mon 10/20/2019, Normal        Note:  This document was prepared using Dragon voice recognition software and may include unintentional dictation errors.  Nanda Quinton, MD, [facility]    Chinaza Rooke, Wonda Olds, MD 10/21/19 [phone]

## 2019-10-20 NOTE — ED Provider Notes (Signed)
  Physical Exam  BP 114/74   Pulse (!) 58   Temp 98.3 F (36.8 C) (Oral)   Resp 18   Ht 5\' 3"  (1.6 m)   Wt 59 kg   LMP 10/03/2019   SpO2 98%   BMI 23.03 kg/m   Physical Exam  ED Course/Procedures     Procedures  MDM   I assumed care of this patient from Dr. 12/03/2019. Patient has come in with sudden onset right-sided abdominal pain.  Imaging did not show any ovarian torsion.  CT scan was ordered, results showed right-sided UVJ stone, 4 mm.  Patient was given Toradol and reassess.  She is now reporting that her pain has improved significantly and she is comfortable going home.  The patient appears reasonably screened and/or stabilized for discharge and I doubt any other medical condition or other [facility] requiring further screening, evaluation, or treatment in the ED at this time prior to discharge.   Results from the ER workup discussed with the patient face to face and all questions answered to the best of my ability. The patient is safe for discharge with strict return precautions.    [facility], MD 10/20/19 1911

## 2019-10-20 NOTE — Discharge Instructions (Signed)
We saw you in the ER for the abdominal pain. °Our results indicate that you have a kidney stone. °We were able to get your pain is relative control, and we can safely send you home. ° °Take the meds prescribed. °Set up an appointment with the Urologist. °If the pain is unbearable, you start having fevers, chills, and are unable to keep any meds down - then return to the ER. °  °

## 2019-10-20 NOTE — ED Triage Notes (Signed)
Pt reports sudden onset RLQ pain and intense sensation to urinate upon waking up this am. Pt reports recently taking vancomycin for c-diff,e-coli infection. Pt reports finished that dose yesterday.pt afebrile, reports constant abd pain with intermittent "piercing pain".

## 2019-10-22 DIAGNOSIS — N200 Calculus of kidney: Secondary | ICD-10-CM | POA: Diagnosis not present

## 2019-10-22 DIAGNOSIS — A0472 Enterocolitis due to Clostridium difficile, not specified as recurrent: Secondary | ICD-10-CM | POA: Diagnosis not present

## 2019-10-22 DIAGNOSIS — K602 Anal fissure, unspecified: Secondary | ICD-10-CM | POA: Diagnosis not present

## 2019-11-10 ENCOUNTER — Ambulatory Visit: Payer: BC Managed Care – PPO | Admitting: Urology

## 2019-12-25 DIAGNOSIS — Z3480 Encounter for supervision of other normal pregnancy, unspecified trimester: Secondary | ICD-10-CM | POA: Diagnosis not present

## 2019-12-25 DIAGNOSIS — Z3201 Encounter for pregnancy test, result positive: Secondary | ICD-10-CM | POA: Diagnosis not present

## 2019-12-25 DIAGNOSIS — Z3481 Encounter for supervision of other normal pregnancy, first trimester: Secondary | ICD-10-CM | POA: Diagnosis not present

## 2020-01-12 DIAGNOSIS — Z113 Encounter for screening for infections with a predominantly sexual mode of transmission: Secondary | ICD-10-CM | POA: Diagnosis not present

## 2020-01-12 DIAGNOSIS — Z3A10 10 weeks gestation of pregnancy: Secondary | ICD-10-CM | POA: Diagnosis not present

## 2020-01-12 DIAGNOSIS — Z36 Encounter for antenatal screening for chromosomal anomalies: Secondary | ICD-10-CM | POA: Diagnosis not present

## 2020-01-12 DIAGNOSIS — Z3A23 23 weeks gestation of pregnancy: Secondary | ICD-10-CM | POA: Diagnosis not present

## 2020-01-12 DIAGNOSIS — Z3482 Encounter for supervision of other normal pregnancy, second trimester: Secondary | ICD-10-CM | POA: Diagnosis not present

## 2020-01-12 DIAGNOSIS — Z3481 Encounter for supervision of other normal pregnancy, first trimester: Secondary | ICD-10-CM | POA: Diagnosis not present

## 2020-01-19 DIAGNOSIS — Z3481 Encounter for supervision of other normal pregnancy, first trimester: Secondary | ICD-10-CM | POA: Diagnosis not present

## 2020-01-19 DIAGNOSIS — O26891 Other specified pregnancy related conditions, first trimester: Secondary | ICD-10-CM | POA: Diagnosis not present

## 2020-01-27 DIAGNOSIS — Z13220 Encounter for screening for lipoid disorders: Secondary | ICD-10-CM | POA: Diagnosis not present

## 2020-01-27 DIAGNOSIS — Z0000 Encounter for general adult medical examination without abnormal findings: Secondary | ICD-10-CM | POA: Diagnosis not present

## 2020-02-20 DIAGNOSIS — G44219 Episodic tension-type headache, not intractable: Secondary | ICD-10-CM | POA: Diagnosis not present

## 2020-02-20 DIAGNOSIS — M9902 Segmental and somatic dysfunction of thoracic region: Secondary | ICD-10-CM | POA: Diagnosis not present

## 2020-02-20 DIAGNOSIS — M546 Pain in thoracic spine: Secondary | ICD-10-CM | POA: Diagnosis not present

## 2020-02-20 DIAGNOSIS — M9901 Segmental and somatic dysfunction of cervical region: Secondary | ICD-10-CM | POA: Diagnosis not present

## 2020-03-19 DIAGNOSIS — Z36 Encounter for antenatal screening for chromosomal anomalies: Secondary | ICD-10-CM | POA: Diagnosis not present

## 2020-03-24 DIAGNOSIS — R946 Abnormal results of thyroid function studies: Secondary | ICD-10-CM | POA: Diagnosis not present

## 2020-04-14 DIAGNOSIS — L290 Pruritus ani: Secondary | ICD-10-CM | POA: Diagnosis not present

## 2020-04-14 DIAGNOSIS — K602 Anal fissure, unspecified: Secondary | ICD-10-CM | POA: Diagnosis not present

## 2020-04-14 DIAGNOSIS — K625 Hemorrhage of anus and rectum: Secondary | ICD-10-CM | POA: Diagnosis not present

## 2020-04-15 DIAGNOSIS — K625 Hemorrhage of anus and rectum: Secondary | ICD-10-CM | POA: Diagnosis not present

## 2020-04-16 ENCOUNTER — Encounter (HOSPITAL_BASED_OUTPATIENT_CLINIC_OR_DEPARTMENT_OTHER): Payer: Self-pay | Admitting: Advanced Practice Midwife

## 2020-08-02 ENCOUNTER — Encounter (HOSPITAL_COMMUNITY): Payer: Self-pay

## 2020-08-02 ENCOUNTER — Inpatient Hospital Stay (HOSPITAL_COMMUNITY): Admit: 2020-08-02 | Payer: BC Managed Care – PPO | Admitting: Obstetrics and Gynecology

## 2020-08-02 SURGERY — Surgical Case
Anesthesia: Regional

## 2021-09-30 ENCOUNTER — Ambulatory Visit (INDEPENDENT_AMBULATORY_CARE_PROVIDER_SITE_OTHER): Payer: BC Managed Care – PPO | Admitting: OTOLARYNGOLOGY

## 2021-09-30 ENCOUNTER — Encounter (INDEPENDENT_AMBULATORY_CARE_PROVIDER_SITE_OTHER): Payer: Self-pay | Admitting: OTOLARYNGOLOGY

## 2021-09-30 ENCOUNTER — Ambulatory Visit (INDEPENDENT_AMBULATORY_CARE_PROVIDER_SITE_OTHER): Payer: BC Managed Care – PPO | Admitting: Audiologist

## 2021-09-30 ENCOUNTER — Other Ambulatory Visit: Payer: Self-pay

## 2021-09-30 VITALS — Temp 97.6°F | Ht 63.0 in | Wt 138.0 lb

## 2021-09-30 DIAGNOSIS — R42 Dizziness and giddiness: Secondary | ICD-10-CM

## 2021-09-30 NOTE — Progress Notes (Signed)
PATIENT NAME:  Darlene Hogan  MRN:  [id number]  DOB:  Jul 26, 1990  DATE OF SERVICE: 09/30/2021    Chief Complaint:  Vertigo and Nose Bleed      HPI:  Darlene Hogan is a 31 y.o. female with vertigo.  Darlene first episode was about 4 months ago.  She was working in her kitchen and she developed true room spinning vertigo.  It lasted about 20 minutes.  It was associated with nausea, but no vomiting.  It resolved and she was able to cook dinner.  She has had 3 or 4 episodes since, but they are not as severe and they do not last as long.  She has a history of migraines, but she has not had a migraine headache in 8 months.  She is not taking any preventative medication for it now.        Past Medical History:  Past Medical History:   Diagnosis Date   . Asthma            Past Surgical History:  Past Surgical History:   Procedure Laterality Date   . CESAREAN SECTION     . HX TONSILLECTOMY             Family History:  Family Medical History:     Problem Relation (Age of Onset)    Diabetes Father    Heart Disease Father    Hypertension (High Blood Pressure) Father            Social History:  Social History     Tobacco Use   Smoking Status Never   Smokeless Tobacco Never     Social History     Substance and Sexual Activity   Alcohol Use Not Currently     Social History     Occupational History   . Not on file       Medications:  Outpatient Medications Marked as Taking for Darlene 09/30/21 encounter (Office Visit) with Dwyane Luo, MD   Medication Sig   . sertraline (ZOLOFT) 100 mg Oral Tablet 1 Tablet (100 mg total)       Allergies:  Allergies   Allergen Reactions   . Amoxicillin Rash and Itching   . Penicillins Rash and Itching   . Zithromax [Azithromycin] Rash and Itching       Review of Systems:  Do you have any fevers: no   Any weight change: no   Change in your vision: no    Chest Pain: no   Shortness of Breath: no   Stomach pain: no   Urinary difficulity: no   Joint Pain: no   Skin Problems: no   Weakness or Numbness:  yes   Easy Bruising or Bleeding: no   Excessive Thirst: no   Seasonal Allergies: yes    All other systems reviewed and found to be negative.    Physical Exam:  Temperature 36.4 C (97.6 F), temperature source Thermal Scan, height 1.6 m (5\' 3" ), weight 62.6 kg (138 lb 0.1 oz).  Body mass index is 24.45 kg/m.  General Appearance: Pleasant, cooperative, healthy, and in no acute distress.  Eyes: Conjunctivae/corneas clear, PERRLA, EOM's intact.  Head and Face: Normocephalic, atraumatic.  Face symmetric, no obvious lesions.   Pinnae: Normal shape and position.   External auditory canals:  Patent without inflammation.  Tympanic membranes:  Intact, translucent, midposition, middle ear aerated.  Nose:  External pyramid midline. Septum midline. Mucosa normal. No purulence, polyps, or crusts.   Oral Cavity/Oropharynx: No mucosal  lesions, masses, or pharyngeal asymmetry.  Hypopharynx/Larynx:  voice normal.  Neck:  No palpable thyroid, salivary gland, or neck masses.  Heme/Lymph:  No cervical adenopathy.  Cardiovascular:  Good perfusion of upper extremities.  No cyanosis of Darlene hands or fingers.  Lungs: No apparent stridorous breathing. No acute distress.  Skin: Skin warm and dry.  Neurologic: Cranial nerves:  grossly intact.Negative Hallpike test.      Psychiatric:  Alert and oriented x 3.    Procedure:       Data Reviewed: Normal audiogram and tympanogram.        Assessment:  dizziness    Plan:  audiogram  We discussed VNG    Dwyane Luo, MD 09/30/2021, 10:55    PCP:  No Pcp  No address on file   REF:  No referring provider defined for this encounter.

## 2021-09-30 NOTE — Progress Notes (Signed)
AUDIOGRAM  Patient is being seen today by Dr Daristotle. Patient reports that she has been experiencing episodes of vertigo. Type A tympanogram were obtained, AU. Type A tymp consistent with normal middle ear function. Results indicate that hearing is within normal limits, AU. Speech discrimination is excellent, AU.   TRH

## 2022-04-10 ENCOUNTER — Ambulatory Visit (INDEPENDENT_AMBULATORY_CARE_PROVIDER_SITE_OTHER): Payer: BC Managed Care – PPO | Admitting: Student in an Organized Health Care Education/Training Program

## 2022-04-10 ENCOUNTER — Other Ambulatory Visit: Payer: BC Managed Care – PPO | Attending: Internal Medicine | Admitting: Internal Medicine

## 2022-04-10 ENCOUNTER — Ambulatory Visit (INDEPENDENT_AMBULATORY_CARE_PROVIDER_SITE_OTHER): Payer: BC Managed Care – PPO

## 2022-04-10 ENCOUNTER — Other Ambulatory Visit: Payer: Self-pay

## 2022-04-10 ENCOUNTER — Encounter (INDEPENDENT_AMBULATORY_CARE_PROVIDER_SITE_OTHER): Payer: Self-pay | Admitting: Internal Medicine

## 2022-04-10 ENCOUNTER — Ambulatory Visit (INDEPENDENT_AMBULATORY_CARE_PROVIDER_SITE_OTHER): Payer: BC Managed Care – PPO | Admitting: Internal Medicine

## 2022-04-10 VITALS — BP 118/60 | HR 68 | Ht 62.99 in | Wt 138.0 lb

## 2022-04-10 DIAGNOSIS — R946 Abnormal results of thyroid function studies: Secondary | ICD-10-CM

## 2022-04-10 DIAGNOSIS — E0590 Thyrotoxicosis, unspecified without thyrotoxic crisis or storm: Secondary | ICD-10-CM

## 2022-04-10 LAB — T3 (TRIIODOTHYRONINE), FREE, SERUM: T3 FREE: 2.7 pg/mL (ref 1.7–3.7)

## 2022-04-10 LAB — THYROID STIMULATING HORMONE (SENSITIVE TSH): TSH: 0.728 u[IU]/mL (ref 0.350–4.940)

## 2022-04-10 LAB — THYROXINE, FREE (FREE T4): THYROXINE (T4), FREE: 0.78 ng/dL (ref 0.70–1.48)

## 2022-04-10 NOTE — Progress Notes (Signed)
Endocrinology, [facility]  [address]  [place] [address]  [phone]  New Patient Visit    Date:   04/10/2022  Name: Darlene Hogan  Age: 31 y.o.  MRN: [id number]    Chief Complaint: New Patient and Hyperthyroidism    History of Present Illness  Darlene Hogan is a 31 y.o. female who presents to Endocrinology Clinic as a referral from Wynetta Emery, NP for hyperthyroidism.      Reviewed progress note written 12/30/2021 by Dr. Mila Homer which states that the patient was found to have a low TSH level.  Also states that the patient had a thyroid ultrasound.    Most recent labs done at an outside system 12/30/2021 showing TSH 0.518 (reference range 0.55-0.78).  Ultrasound thyroid done at [facility] 12/26/2021 was without nodularity and with normal blood flow.    Patient states that she was having brain fog and forgetfulness and that is what prompted thyroid testing. Can remember where she is going, but other things like remembering what she had for breakfast are hard to remember.    Has a 31 year old and a 31 year old.    Pertinent History:  Prior known thyroid disease:  no   Prior history of thyroid biopsy or thyroid surgery:  no  History of radiation to the head or neck:  no  Taking any iodine supplementation:  no  Taking any biotin supplementation:  no  Any OTC thyroid supplement, kelp, or ashwaganda:  no  Recent IV contrast administration (within past 6 months):  no  Recent URI (within past 6 months):  no  Recent pregnancy, miscarriage?:  no  Family history of thyroid disease:  hypothyroidism in mother, sister, father, maternal great grandmother.  Family history of thyroid cancer:  paternal uncle  Family history of autoimmune disease:  mother has RA, sister has lupus.    Symptoms:  Fatigue/Energy level:  would love more energy, but thinks hers is normal.  Mood changes:  not sure. Nothing significant.  Weight changes:  gained 7 lbs in last 6  months.  Tremors:  always has these, but not new.  Palpitations:  when she is anxious.  Constipation:  no  Diarrhea:  no  Abdominal Pain:  no  Hot/cold intolerance:  never comfortable.  Sweating:  no  Increased neck size:  no  Menses:  only just got her period back three months ago. That is when she stopped nursing  Dry or gritty sensation in the eyes:  no  Increased prominence of the eyes:  no  Not on any form of contraception currently.      Past Medical History:  Asthma   Migraine  Anxiety  Lactose intolerance    Past surgical history:   C-section  Tonsillectomy    Medications:  Current Outpatient Medications   Medication Sig    cholecalciferol, vitamin D3, 25 mcg (1,000 unit) Oral Tablet Take 1 Tablet (1,000 Units total) by mouth Once a day    cyanocobalamin, vitamin B-12, (VITAMIN B12 ORAL) Take by mouth Once a day    ferrous sulfate (FERATAB) 324 mg (65 mg iron) Oral Tablet, Delayed Release (E.C.) Take 1 Tablet (324 mg total) by mouth Every morning before breakfast    multivit-min/ferrous fumarate (MULTI VITAMIN ORAL) Take by mouth Once a day    sertraline (ZOLOFT) 100 mg Oral Tablet 1 Tablet (100 mg total)       Family History:  Family history of thyroid disease:  hypothyroidism in mother,  sister, father, maternal great grandmother.  Family history of thyroid cancer:  paternal uncle  Family history of autoimmune disease:  mother has RA, sister has lupus.    Social History:  Smokes when very overwhelmed but not often.      Examination:  BP 118/60   Pulse 68   Ht 1.6 m (5' 2.99")   Wt 62.6 kg (138 lb)   SpO2 97%   BMI 24.45 kg/m   General:  Well appearing, appears stated age, and is in no distress.  Vital signs reviewed.  Eyes:  Conjunctiva clear.  EOMI.  Sclera non-icteric.  No proptosis.  HENT:  Normocephalic, atraumatic.  Neck:  Trachea midline.  Thyroid without palpable nodularity or enlargement.  No tenderness to palpation either.  Heme/Lymph:  Single, slightly tender enlarged lymph node in the left  submandibular area.  Otherwise no other cervical, preauricular, posterior auricular, tonsillar, submandibular, or submental lymphadenopathy.  Respiratory:  No audible wheezing or increased work of breathing.  Cardiovascular:  Regular rate and rhythm.  Musculoskeletal:  No cyanosis or edema.  No obvious deformities.  Skin:  Warm and dry.  No rashes or lesions noted.    Neurologic:  Alert and oriented.  Cranial nerves II-XII grossly intact.  Gait normal.  Psychiatric:  Behavior and affect normal.        Data reviewed:  I reviewed the studies shown below:    Outside labs 12/30/2021 showing TSH 0.518 (reference range 0.55-0.78)   Outside labs 12/05/2021 showing TSH 0.253 (0.55-4.78)            Assessment and Plan:  Darlene Hogan is a 31 y.o. female who presents to Endocrinology Clinic as a referral from Wynetta Emery, NP for hyperthyroidism.      Low TSH  -Most recent labs 12/30/2021 showing TSH 0.518 (reference range 0.55-4.78) but I do not see that a free T4 or free T3 were ordered.  -Differential includes Graves disease and thyroiditis.  Highly unlikely to be toxic adenoma or multinodular goiter given absence of nodules on most recent thyroid ultrasound done 12/26/2021 at [facility].  -Will repeat TSH, free T4, and obtain free T3 today.  -Will order TSI, TPO, and thyrotropin receptor (TRAb) antibodies to evaluate for Graves Disease.  -Will screen for pregnancy with serum hCG. Patient states that she and her husband take extreme precautions since her husband is taking methotrexate.  States highly unlikely that she is pregnant but agreeable to me placing orders for this blood test.  -Discussed that treatment options will depend on the etiology of the hyperthyroidism/thyrotoxicosis, but commonly start with medications.  If thyroiditis, this is self-limited and resolved on its own without the need for treatments other than symptomatic treatments.  Will discuss further if labs are suggestive of  hyperthyroidism.      Patient in agreement with the above plan and all questions were answered prior to conclusion of the encounter.    RTC 6 months.  If labs normal, will cancel follow-up and can continue following with PCP.    Orders Placed This Encounter    THYROXINE, FREE (FREE T4)    T3 (TRIIODOTHYRONINE), FREE, SERUM    THYROID STIMULATING IMMUNOGLOBULIN (TSI), SERUM    THYROID STIMULATING HORMONE (SENSITIVE TSH)    TRAB (TSH RECEPTOR ANTIBODY)    THYROPEROXIDASE (TPO) ANTIBODIES, SERUM     This note may have been partially generated using MModal Fluency Direct system, and there may be some incorrect words, spellings, and punctuation that were not noted in  checking the note before saving.    Sharen Heck, MD 04/10/2022, 11:39  [facility] [place] [facility]

## 2022-04-10 NOTE — Nursing Note (Signed)
Venipuncture: Left AC/ 22g needle/ No tourniquet used  Labs Obtained: ATPO, FT3, FT4, TRAB, TSH, TSI  Per orders placed by: Clent Ridges. Radford Pax, MD  Sent to [facility] lab  Patient Tolerated Well  Karie Chimera, MA

## 2022-04-12 LAB — THYROPEROXIDASE (TPO) ANTIBODIES, SERUM: ANTI THYROPEROXIDASE ANTIBODIES: 12 IU/mL (ref <=14)

## 2022-04-12 LAB — TRAB (TSH RECEPTOR ANTIBODY): TSH (THYROTROPIN) RECEPTOR BINDING ANTIBODY: <0.9 IU/L (ref <=1.75)

## 2022-04-14 LAB — THYROID STIMULATING IMMUNOGLOBULIN (TSI), SERUM: TSI: <89 % baseline (ref <140)

## 2022-05-13 IMAGING — CT CT ABD-PELV W/ CM
2 of 4 series · 16 of 46 positions shown, 18 images · IV contrast (Omnipaque or Isovue)
Comparison: Abdominal ultrasound dated 10/20/2019.

CLINICAL DATA: 28-year-old female with right lower quadrant
abdominal pain.

EXAM:
CT ABDOMEN AND PELVIS WITH CONTRAST
TECHNIQUE: Multidetector CT imaging of the abdomen and pelvis was performed
using the standard protocol following bolus administration of
intravenous contrast.
CONTRAST:  100mL OMNIPAQUE IOHEXOL 300 MG/ML  SOLN

[Series 2: axial st · axial · 0.68mm/px · z∈[-425,-25]mm · 13 of 88 slices shown, 15 images]
[im 4/88  soft-tissue]
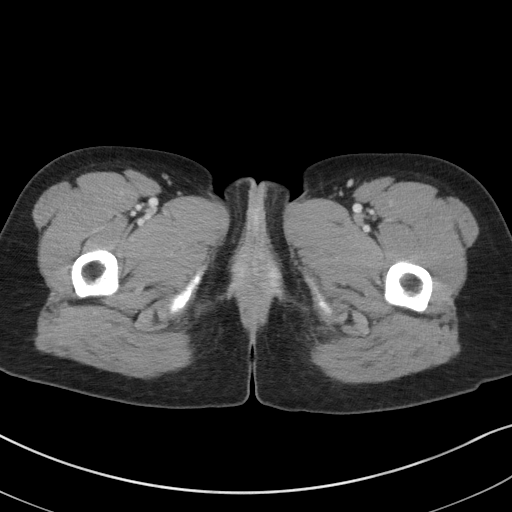
[im 4/88  bone]
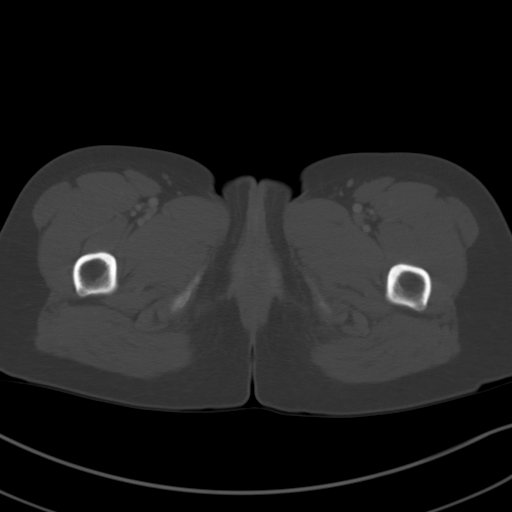
[im 11/88  soft-tissue]
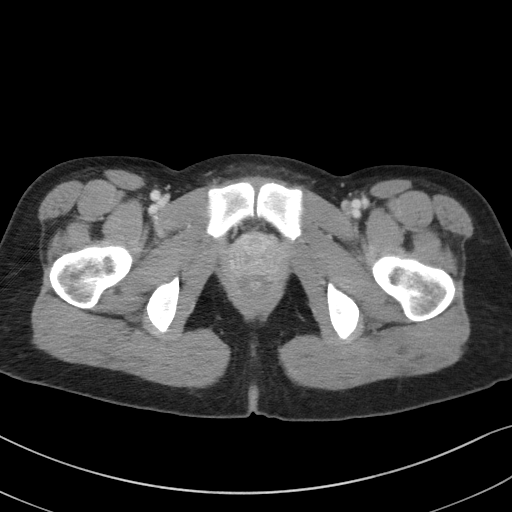
[im 18/88  soft-tissue]
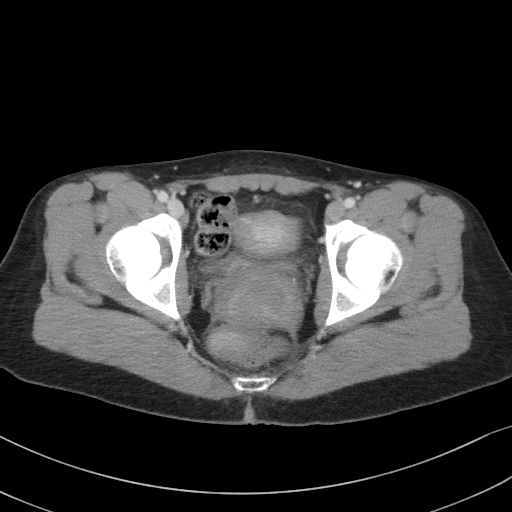
[im 25/88  soft-tissue]
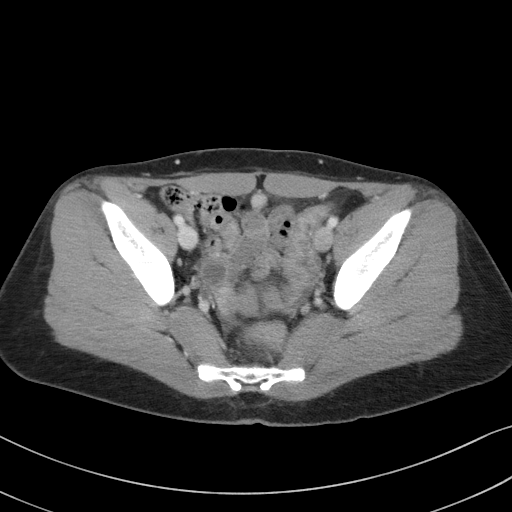
[im 32/88  soft-tissue]
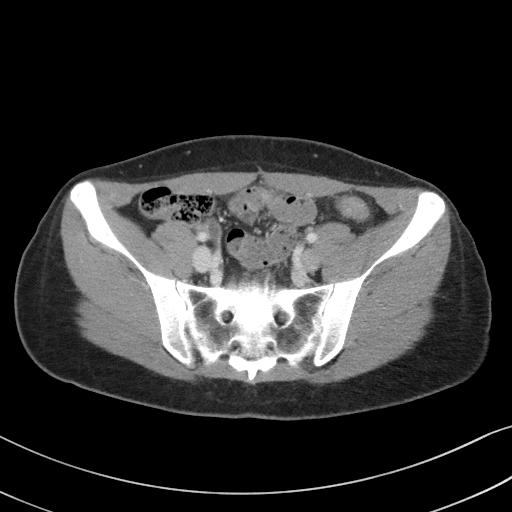
[im 39/88  soft-tissue]
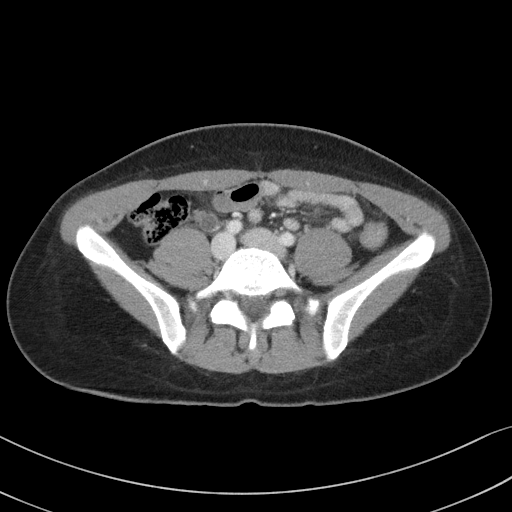
[im 46/88  soft-tissue]
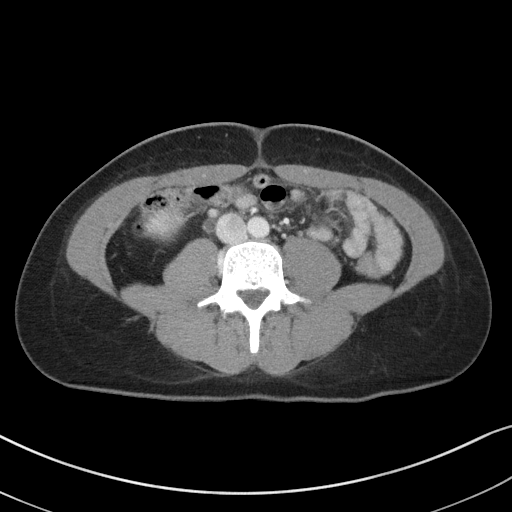
[im 49/88  soft-tissue]
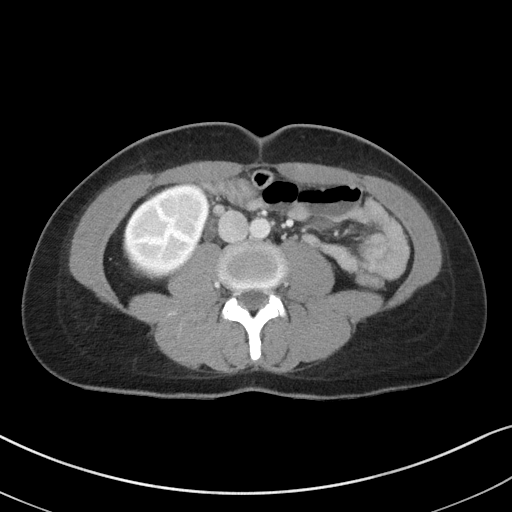
[im 56/88  soft-tissue]
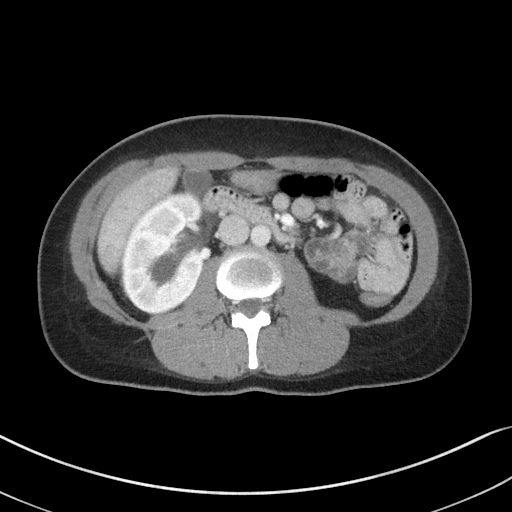
[im 56/88  bone]
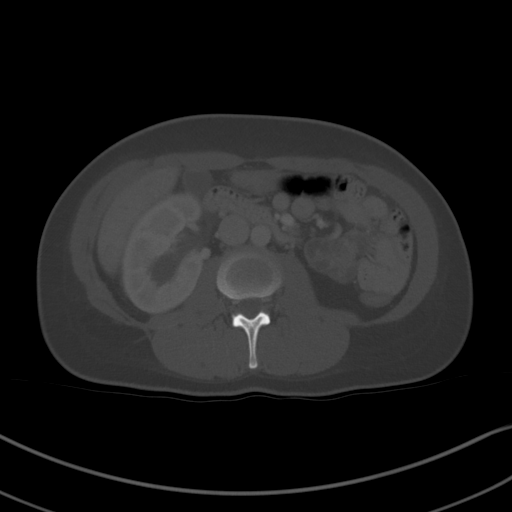
[im 63/88  soft-tissue]
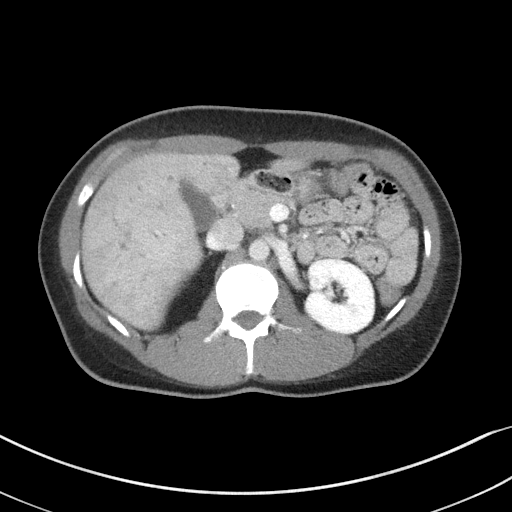
[im 70/88  soft-tissue]
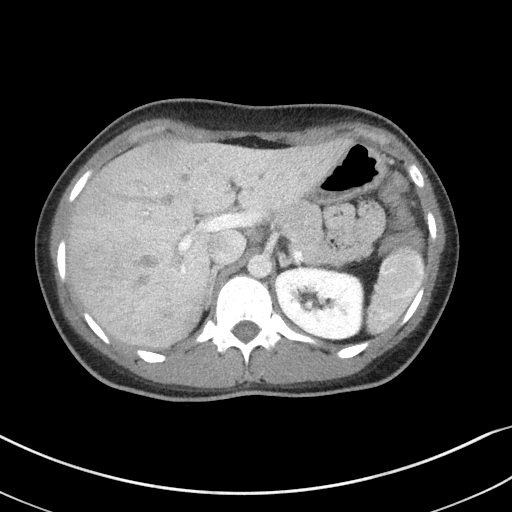
[im 77/88  soft-tissue]
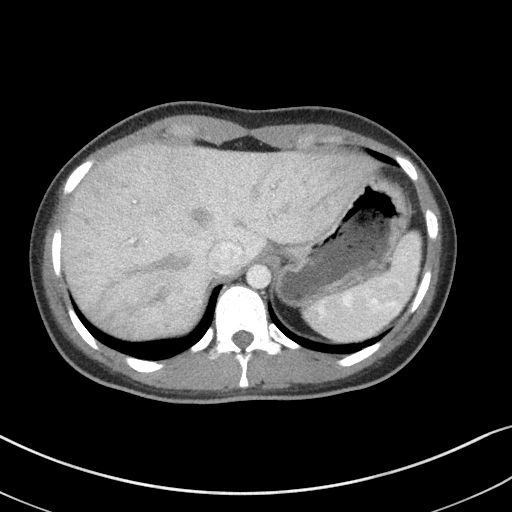
[im 84/88  soft-tissue]
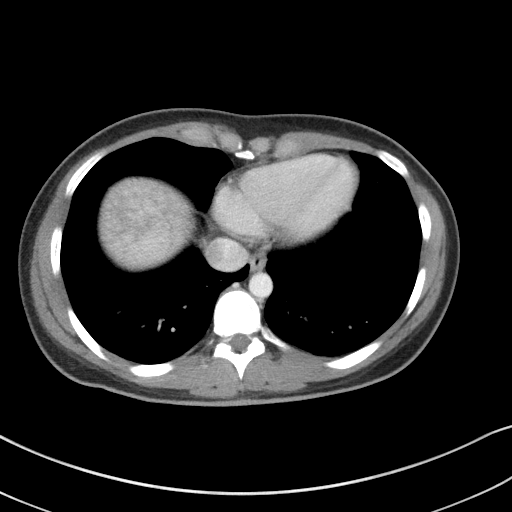

[Series 5: coronal st · coronal · 0.76mm/px · 3 of 70 slices shown]
[im 24/70  soft-tissue]
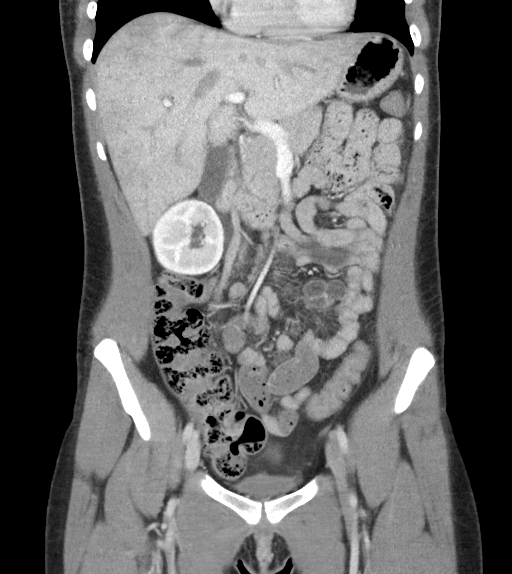
[im 31/70  soft-tissue]
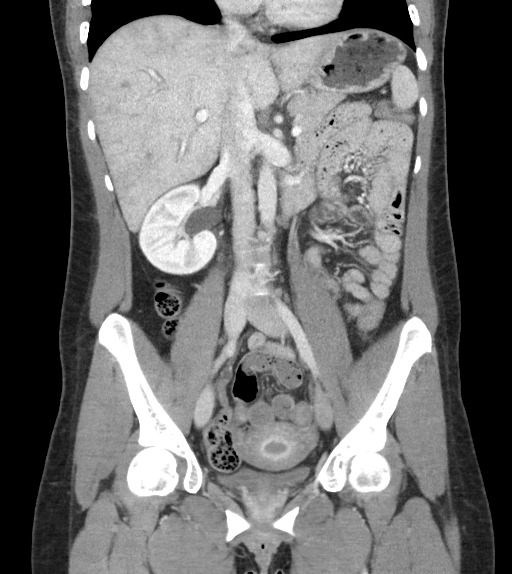
[im 39/70  soft-tissue]
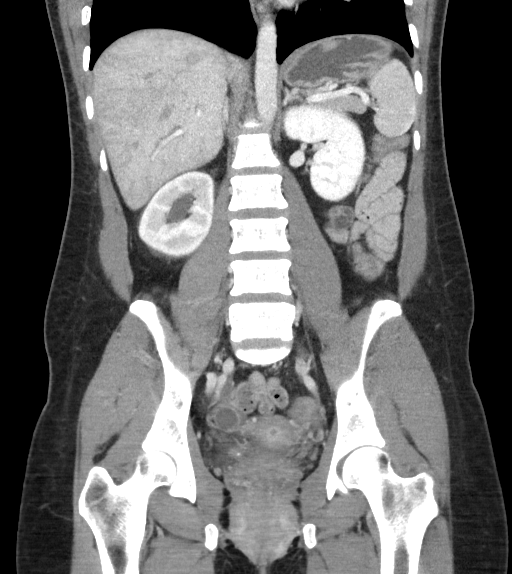

[16 of 46 positions shown; findings below may reference images not displayed]

FINDINGS: Lower chest: The visualized lung bases are clear.

No intra-abdominal free air. Trace free fluid in the pelvis.

Hepatobiliary: No focal liver abnormality is seen. No gallstones,
gallbladder wall thickening, or biliary dilatation.

Pancreas: Unremarkable. No pancreatic ductal dilatation or
surrounding inflammatory changes.

Spleen: Normal in size without focal abnormality.

Adrenals/Urinary Tract: The adrenal glands are unremarkable. There
is a 4 mm obstructing stone at the right ureterovesical junction.
There is mild to moderate right hydronephroureter. The left kidney,
left ureter appear unremarkable. The urinary bladder is collapsed

Stomach/Bowel: There is no bowel obstruction or active inflammation.
The appendix is normal.

Vascular/Lymphatic: The abdominal aorta and IVC are unremarkable. No
portal venous gas. There is no adenopathy.

Reproductive: The uterus is anteverted and grossly unremarkable.
There is a 2 cm right ovarian corpus luteum or dominant follicle.

Other: None

Musculoskeletal: No acute or significant osseous findings.
IMPRESSION: 1. A 4 mm right UVJ stone with mild to moderate right
hydronephroureter.
2. No bowel obstruction. Normal appendix.

## 2022-06-22 ENCOUNTER — Ambulatory Visit (INDEPENDENT_AMBULATORY_CARE_PROVIDER_SITE_OTHER): Payer: BC Managed Care – PPO | Admitting: Student in an Organized Health Care Education/Training Program

## 2022-08-21 NOTE — Telephone Encounter (Signed)
na

## 2022-10-04 ENCOUNTER — Encounter (INDEPENDENT_AMBULATORY_CARE_PROVIDER_SITE_OTHER): Payer: Self-pay | Admitting: Internal Medicine

## 2022-10-28 ENCOUNTER — Emergency Department
Admission: EM | Admit: 2022-10-28 | Discharge: 2022-10-28 | Disposition: A | Discharge status: Home / Self Care | Payer: BC Managed Care – PPO | Attending: Emergency Medicine | Admitting: Emergency Medicine

## 2022-10-28 ENCOUNTER — Emergency Department (EMERGENCY_DEPARTMENT_HOSPITAL): Payer: BC Managed Care – PPO

## 2022-10-28 ENCOUNTER — Other Ambulatory Visit: Payer: Self-pay

## 2022-10-28 ENCOUNTER — Encounter (HOSPITAL_COMMUNITY): Payer: Self-pay

## 2022-10-28 ENCOUNTER — Emergency Department (HOSPITAL_COMMUNITY): Payer: BC Managed Care – PPO | Admitting: Radiology

## 2022-10-28 DIAGNOSIS — M25572 Pain in left ankle and joints of left foot: Secondary | ICD-10-CM | POA: Insufficient documentation

## 2022-10-28 DIAGNOSIS — O26892 Other specified pregnancy related conditions, second trimester: Secondary | ICD-10-CM | POA: Insufficient documentation

## 2022-10-28 DIAGNOSIS — M79606 Pain in leg, unspecified: Secondary | ICD-10-CM

## 2022-10-28 DIAGNOSIS — M25579 Pain in unspecified ankle and joints of unspecified foot: Secondary | ICD-10-CM

## 2022-10-28 DIAGNOSIS — M79605 Pain in left leg: Secondary | ICD-10-CM | POA: Insufficient documentation

## 2022-10-28 DIAGNOSIS — Z3A25 25 weeks gestation of pregnancy: Secondary | ICD-10-CM | POA: Insufficient documentation

## 2022-10-28 NOTE — ED Nurses Note (Signed)
Pt discharged to home. Written and verbal discharge instructions given to patient with verbal understanding of discharge instructions. Pt has no questions. Pt ambulated out of ED without difficulty.

## 2022-10-28 NOTE — ED Triage Notes (Signed)
Pt c/o left ankle swelling that began a couple of weeks ago. Pt states her OB/ GYN is concerned for possible blood clot

## 2022-10-28 NOTE — ED Provider Notes (Signed)
[facility] Emergency Department           Clinical Impression   Leg pain   Ankle pain, unspecified chronicity, unspecified laterality (Primary)       ED Visit Narrative (includes information regarding HPI, Pertinent ROS, ED Course, MDM):  No acute distress  Presents for evaluation of left ankle pain   Ongoing for the past 3 weeks   Approximately [redacted] weeks pregnant  Reports that her OBGYN was concerned for a blood clot due to recent travel    No significant swelling on exam   No skin color change   No significant tenderness   Has been ambulatory   No history of deep vein thrombosis or PE     Will obtain x-rays given patient's reported pains medial left ankle, not terribly consistent with deep vein thrombosis  Duplex as well   Re-evaluate    18:48  Late entry  Refused xray  Duplex negative  Appropraite for d/c home  Discussed care at home and reasons to return  All questions sought and answered     Medical Decision Making  Amount and/or Complexity of Data Reviewed  Radiology: ordered.        Consults: none      Disposition: Discharged           Discharge Medication List as of 10/28/2022  2:24 PM          Chief Complaint:  Patient presents with     Chief Complaint   Patient presents with    Ankle Pain       Physical Exam  Vitals and nursing note reviewed.   Constitutional:       General: She is not in acute distress.     Appearance: She is well-developed.   HENT:      Head: Normocephalic and atraumatic.   Eyes:      Conjunctiva/sclera: Conjunctivae normal.   Cardiovascular:      Rate and Rhythm: Normal rate and regular rhythm.      Pulses: Normal pulses.   Pulmonary:      Effort: Pulmonary effort is normal. No respiratory distress.   Abdominal:      General: There is no distension.      Palpations: Abdomen is soft.      Tenderness: There is no guarding.   Musculoskeletal:         General: No tenderness, deformity or signs of injury. Normal range of motion.      Cervical back: Normal range of motion and  neck supple.      Right lower leg: No edema.      Left lower leg: No edema.   Skin:     General: Skin is warm and dry.      Capillary Refill: Capillary refill takes less than 2 seconds.      Findings: No erythema.   Neurological:      General: No focal deficit present.      Mental Status: She is alert and oriented to person, place, and time.      Motor: No abnormal muscle tone.         Past Medical History:  See History in Epic    Allergies   Allergen Reactions    Amoxicillin Rash and Itching    Penicillins Rash and Itching    Zithromax [Azithromycin] Rash and Itching       Vital Signs:  Pre-disposition vitals  Filed Vitals:    10/28/22 1323 10/28/22 1428  BP: (!) 109/57    Pulse: 73 71   Resp: 18 18   Temp: 36.8 C (98.2 F)    SpO2: 96% 97%       Old records reviewed by me:  none     Diagnostics:    Labs:  No results found for any visits on 10/28/22.  Labs reviewed and interpreted by me.    Radiology:         Orders:  Orders Placed This Encounter    CANCELED: XR ANKLE LEFT    PERIPHERAL VENOUS DUPLEX - LOWER

## 2022-10-28 NOTE — Discharge Instructions (Signed)
There was no evidence of any deep vein thrombosis on her ultrasound today.  If you experience any worsening swelling or pain or color change to the leg, please return to the emergency department for further evaluation.
